# Patient Record
Sex: Female | Born: 1960 | Race: White | Hispanic: No | Marital: Single | Smoking: Current some day smoker
Health system: Southern US, Community
[De-identification: ages and names within clinical notes are randomized; demographics above are authoritative.]

## PROBLEM LIST (undated history)

## (undated) DIAGNOSIS — G5600 Carpal tunnel syndrome, unspecified upper limb: Secondary | ICD-10-CM

## (undated) DIAGNOSIS — N289 Disorder of kidney and ureter, unspecified: Secondary | ICD-10-CM

## (undated) DIAGNOSIS — R069 Unspecified abnormalities of breathing: Secondary | ICD-10-CM

## (undated) DIAGNOSIS — M1990 Unspecified osteoarthritis, unspecified site: Secondary | ICD-10-CM

## (undated) DIAGNOSIS — K7460 Unspecified cirrhosis of liver: Secondary | ICD-10-CM

## (undated) DIAGNOSIS — J449 Chronic obstructive pulmonary disease, unspecified: Secondary | ICD-10-CM

## (undated) HISTORY — DX: Chronic obstructive pulmonary disease, unspecified: J44.9

## (undated) HISTORY — PX: ABDOMINAL HYSTERECTOMY: SHX81

## (undated) HISTORY — DX: Unspecified cirrhosis of liver: K74.60

## (undated) HISTORY — DX: Unspecified abnormalities of breathing: R06.9

## (undated) HISTORY — PX: APPENDECTOMY: SHX54

---

## 2004-07-28 DIAGNOSIS — K7460 Unspecified cirrhosis of liver: Secondary | ICD-10-CM

## 2004-07-28 HISTORY — DX: Unspecified cirrhosis of liver: K74.60

## 2012-07-28 HISTORY — PX: HAND SURGERY: SHX662

## 2013-07-28 DIAGNOSIS — G5600 Carpal tunnel syndrome, unspecified upper limb: Secondary | ICD-10-CM

## 2013-07-28 HISTORY — DX: Carpal tunnel syndrome, unspecified upper limb: G56.00

## 2015-06-04 ENCOUNTER — Emergency Department
Admission: EM | Admit: 2015-06-04 | Discharge: 2015-06-04 | Disposition: A | Discharge status: Home / Self Care | Payer: Medicaid Other | Attending: Emergency Medicine | Admitting: Emergency Medicine

## 2015-06-04 ENCOUNTER — Emergency Department: Payer: Medicaid Other

## 2015-06-04 DIAGNOSIS — J441 Chronic obstructive pulmonary disease with (acute) exacerbation: Secondary | ICD-10-CM | POA: Insufficient documentation

## 2015-06-04 DIAGNOSIS — Z720 Tobacco use: Secondary | ICD-10-CM | POA: Insufficient documentation

## 2015-06-04 DIAGNOSIS — J189 Pneumonia, unspecified organism: Secondary | ICD-10-CM | POA: Insufficient documentation

## 2015-06-04 DIAGNOSIS — Z880 Allergy status to penicillin: Secondary | ICD-10-CM | POA: Insufficient documentation

## 2015-06-04 DIAGNOSIS — R0602 Shortness of breath: Secondary | ICD-10-CM

## 2015-06-04 DIAGNOSIS — R079 Chest pain, unspecified: Secondary | ICD-10-CM

## 2015-06-04 HISTORY — DX: Unspecified osteoarthritis, unspecified site: M19.90

## 2015-06-04 HISTORY — DX: Disorder of kidney and ureter, unspecified: N28.9

## 2015-06-04 HISTORY — DX: Carpal tunnel syndrome, unspecified upper limb: G56.00

## 2015-06-04 LAB — CBC
HCT: 41 % (ref 35.0–47.0)
HEMOGLOBIN: 14.2 g/dL (ref 12.0–16.0)
MCH: 31.1 pg (ref 26.0–34.0)
MCHC: 34.5 g/dL (ref 32.0–36.0)
MCV: 90.2 fL (ref 80.0–100.0)
Platelets: 304 10*3/uL (ref 150–440)
RBC: 4.55 MIL/uL (ref 3.80–5.20)
RDW: 13 % (ref 11.5–14.5)
WBC: 8.1 10*3/uL (ref 3.6–11.0)

## 2015-06-04 LAB — BASIC METABOLIC PANEL
ANION GAP: 16 — AB (ref 5–15)
BUN: 6 mg/dL (ref 6–20)
CHLORIDE: 102 mmol/L (ref 101–111)
CO2: 24 mmol/L (ref 22–32)
CREATININE: 0.77 mg/dL (ref 0.44–1.00)
Calcium: 9.1 mg/dL (ref 8.9–10.3)
GFR calc non Af Amer: >60 mL/min (ref >60)
Glucose, Bld: 108 mg/dL — ABNORMAL HIGH (ref 65–99)
POTASSIUM: 3.8 mmol/L (ref 3.5–5.1)
SODIUM: 142 mmol/L (ref 135–145)

## 2015-06-04 LAB — TROPONIN I: Troponin I: <0.03 ng/mL (ref <0.031)

## 2015-06-04 MED ORDER — BENZONATATE 100 MG PO CAPS: 100.0000 mg | ORAL_CAPSULE | Freq: Four times a day (QID) | ORAL | Status: AC | PRN

## 2015-06-04 MED ORDER — LEVOFLOXACIN IN D5W 750 MG/150ML IV SOLN
750.0000 mg | Freq: Once | INTRAVENOUS | Status: AC
  Administered 2015-06-04: 750 mg via INTRAVENOUS
  Filled 2015-06-04: qty 150

## 2015-06-04 MED ORDER — IPRATROPIUM-ALBUTEROL 0.5-2.5 (3) MG/3ML IN SOLN
3.0000 mL | Freq: Once | RESPIRATORY_TRACT | Status: AC
  Administered 2015-06-04: 3 mL via RESPIRATORY_TRACT
  Filled 2015-06-04 (×2): qty 3

## 2015-06-04 MED ORDER — LEVOFLOXACIN IN D5W 750 MG/150ML IV SOLN
INTRAVENOUS | Status: AC
  Administered 2015-06-04: 750 mg via INTRAVENOUS
  Filled 2015-06-04: qty 150

## 2015-06-04 MED ORDER — IPRATROPIUM-ALBUTEROL 0.5-2.5 (3) MG/3ML IN SOLN
3.0000 mL | Freq: Once | RESPIRATORY_TRACT | Status: AC
  Administered 2015-06-04: 3 mL via RESPIRATORY_TRACT
  Filled 2015-06-04: qty 3

## 2015-06-04 MED ORDER — LEVOFLOXACIN 750 MG PO TABS: 750.0000 mg | ORAL_TABLET | Freq: Every day | ORAL | Status: AC

## 2015-06-04 MED ORDER — METHYLPREDNISOLONE SODIUM SUCC 125 MG IJ SOLR
125.0000 mg | Freq: Once | INTRAMUSCULAR | Status: AC
  Administered 2015-06-04: 125 mg via INTRAVENOUS

## 2015-06-04 MED ORDER — PREDNISONE 20 MG PO TABS
60.0000 mg | ORAL_TABLET | Freq: Every day | ORAL | Status: AC
Start: 2015-06-04 — End: 2016-06-03

## 2015-06-04 MED ORDER — METHYLPREDNISOLONE SODIUM SUCC 125 MG IJ SOLR
INTRAMUSCULAR | Status: AC
  Administered 2015-06-04: 125 mg via INTRAVENOUS
  Filled 2015-06-04: qty 2

## 2015-06-04 NOTE — ED Provider Notes (Signed)
[facility] Emergency Department Provider Note  ____________________________________________  Time seen: Approximately 5:02 PM  I have reviewed the triage vital signs and the nursing notes.   HISTORY  Chief Complaint Shortness of Breath; Pneumonia; and Cough    HPI Lisa Stout is a 54 y.o. female with a history of COPD and ongoing tobacco abuse presenting with 1 week of cough productive of yellow thick phlegm, fever to 103, and shortness of breath with exertion. Patient has also been having myalgias. She has been taking albuterol nebulizers at home with minimal improvement. Patient also reports a central chest "pressure" that is there all the time; nothing makes it better or worse.   Past Medical History  Diagnosis Date  . Carpal tunnel syndrome   . Arthritis   . Renal disorder     There are no active problems to display for this patient.   History reviewed. No pertinent past surgical history.  Current Outpatient Rx  Name  Route  Sig  Dispense  Refill  . benzonatate (TESSALON PERLES) 100 MG capsule   Oral   Take 1 capsule (100 mg total) by mouth every 6 (six) hours as needed for cough.   20 capsule   0   . levofloxacin (LEVAQUIN) 750 MG tablet   Oral   Take 1 tablet (750 mg total) by mouth daily.   7 tablet   0   . predniSONE (DELTASONE) 20 MG tablet   Oral   Take 3 tablets (60 mg total) by mouth daily.   21 tablet   0     Allergies Codeine; Penicillins; and Tramadol  No family history on file.  Social History Social History  Substance Use Topics  . Smoking status: Current Some Day Smoker  . Smokeless tobacco: None  . Alcohol Use: No    Review of Systems Constitutional: Positive fever. Negative lightheadedness or syncope. Eyes: No visual changes. ENT: No sore throat. Cardiovascular: Positive chest pain, negative palpitations. Respiratory: Positive shortness of breath.  Positive productive cough. Gastrointestinal: No  abdominal pain.  No nausea, no vomiting.  No diarrhea.  No constipation. Genitourinary: Negative for dysuria. Musculoskeletal: Negative for back pain. Skin: Negative for rash. Neurological: Negative for headaches, focal weakness or numbness.  10-point ROS otherwise negative.  ____________________________________________   PHYSICAL EXAM:  VITAL SIGNS: ED Triage Vitals  Enc Vitals Group     BP 06/04/15 1624 124/68 mmHg     Pulse Rate 06/04/15 1624 105     Resp 06/04/15 1624 22     Temp 06/04/15 1624 98.1 F (36.7 C)     Temp Source 06/04/15 1624 Oral     SpO2 06/04/15 1624 98 %     Weight 06/04/15 1624 160 lb (72.576 kg)     Height 06/04/15 1624 5\' 4"  (1.626 m)     Head Cir --      Peak Flow --      Pain Score 06/04/15 1625 8     Pain Loc --      Pain Edu? --      Excl. in GC? --     Constitutional: Alert and oriented. Well appearing and in no acute distress. Answer question appropriately. Eyes: Conjunctivae are normal.  EOMI. Head: Atraumatic. Nose: No congestion/rhinnorhea. Mouth/Throat: Mucous membranes are moist.  Neck: No stridor.  Supple.  No JVD. Cardiovascular: Normal rate, regular rhythm. No murmurs, rubs or gallops.  Respiratory: Mild tachypnea but able to speak in full sentences. No accessory muscle use or  retractions. Patient has mild Rales diffusely in the right lung field; left lung field is clear. No wheezes or rhonchi at this time. Gastrointestinal: Soft and nontender. No distention. No peritoneal signs. Musculoskeletal: No LE edema. No palpable cords or calf tenderness to palpation; no Homans sign. Neurologic:  Normal speech and language. No gross focal neurologic deficits are appreciated.  Skin:  Skin is warm, dry and intact. No rash noted. Psychiatric: Mood and affect are normal. Speech and behavior are normal.  Normal judgement.  ____________________________________________   LABS (all labs ordered are listed, but only abnormal results are  displayed)  Labs Reviewed  BASIC METABOLIC PANEL - Abnormal; Notable for the following:    Glucose, Bld 108 (*)    Anion gap 16 (*)    All other components within normal limits  CBC  TROPONIN I   ____________________________________________  EKG  ED ECG REPORT I, Rockne Menghini, the attending physician, personally viewed and interpreted this ECG.   Date: 06/04/2015  EKG Time: 1630  Rate: 88  Rhythm: normal sinus rhythm  Axis: Leftward  Intervals:none  ST&T Change: No ST elevation. No ischemic changes.  ____________________________________________  RADIOLOGY  Dg Chest 2 View  06/04/2015  CLINICAL DATA:  Cough, shortness of breath for 5 days. EXAM: CHEST  2 VIEW COMPARISON:  None. FINDINGS: The heart size and mediastinal contours are within normal limits. Both lungs are clear. The visualized skeletal structures are unremarkable. IMPRESSION: No active cardiopulmonary disease. Electronically Signed   By: Charlett Nose M.D.   On: 06/04/2015 16:46    ____________________________________________   PROCEDURES  Procedure(s) performed: None  Critical Care performed: No ____________________________________________   INITIAL IMPRESSION / ASSESSMENT AND PLAN / ED COURSE  Pertinent labs & imaging results that were available during my care of the patient were reviewed by me and considered in my medical decision making (see chart for details).  54 y.o. female with a history of COPD presenting with 1 week of productive cough, fever and shortness of breath. Here we have a related the patient and she does maintain her oxygen saturations at 97% or greater with ambulation. She does become more And mildly tachycardic with walking but this resolves when she rests. Chest x-ray does not show any infiltrate, however I do hear rales in her right lung base. I'll plan to do basic lab work and initiate IV steroids and antibiotics; however if the patient is feeling better, we will plan for  discharge and oral antibiotics with close PMD follow-up.  ----------------------------------------- 6:44 PM on 06/04/2015 -----------------------------------------  The patient is afebrile and does not have any infiltrate on exam. She does not have an elevated white blood cell count. She is able to ambulate and maintain her oxygen saturation. She has received a DuoNeb, steroids, and IV antibiotics 1. I'll plan to discharge her home with oral antibiotics and PMD follow-up tomorrow. She understands return precautions and follow-up instructions. ____________________________________________  FINAL CLINICAL IMPRESSION(S) / ED DIAGNOSES  Final diagnoses:  Pneumonia, unspecified laterality, unspecified part of lung  Shortness of breath  Chest pain, unspecified chest pain type      NEW MEDICATIONS STARTED DURING THIS VISIT:  Discharge Medication List as of 06/04/2015  6:49 PM    START taking these medications   Details  benzonatate (TESSALON PERLES) 100 MG capsule Take 1 capsule (100 mg total) by mouth every 6 (six) hours as needed for cough., Starting 06/04/2015, Until Tue 06/03/16, Print    levofloxacin (LEVAQUIN) 750 MG tablet Take 1 tablet (  750 mg total) by mouth daily., Starting 06/04/2015, Until Mon 06/11/15, Print    predniSONE (DELTASONE) 20 MG tablet Take 3 tablets (60 mg total) by mouth daily., Starting 06/04/2015, Until Tue 06/03/16, Print         Rockne Menghini, MD 06/04/15 2219

## 2015-06-04 NOTE — Discharge Instructions (Signed)
Please take the entire course of antibiotics and steroids even if you're feeling better. Please make a follow-up appointment with your regular doctor at the [facility] clinic tomorrow or the next day.  Please return to the emergency department if he developed shortness of breath, fever, inability to keep down fluids, chest pain, or any other symptoms concerning to you.

## 2015-06-04 NOTE — ED Notes (Signed)
Pt c/o cough, SOB X 5 days. Pt believes that she has pneumonia. Pt reports fever 99-103F. Pt productive sputum yellow in color. Pt has been using inhaler with no relief. Pt in no RR distress at this time. Color WNL. Tylenol taken PTA

## 2015-06-04 NOTE — ED Notes (Signed)
Pt in with complaints of SOB, fever, diarrhea, vomiting, body aches x 1 week.  Pt reports using albuterol nebulizer treatments at home q4hrs which have not helped breathing.

## 2015-10-09 ENCOUNTER — Ambulatory Visit: Payer: Medicaid Other | Attending: Anesthesiology | Admitting: Anesthesiology

## 2015-10-09 ENCOUNTER — Encounter: Payer: Self-pay | Admitting: Anesthesiology

## 2015-10-09 VITALS — BP 101/87 | HR 67 | Temp 97.5°F | Resp 18 | Ht 64.0 in | Wt 160.0 lb

## 2015-10-09 DIAGNOSIS — Z9889 Other specified postprocedural states: Secondary | ICD-10-CM | POA: Insufficient documentation

## 2015-10-09 DIAGNOSIS — M542 Cervicalgia: Secondary | ICD-10-CM | POA: Insufficient documentation

## 2015-10-09 DIAGNOSIS — M5416 Radiculopathy, lumbar region: Secondary | ICD-10-CM

## 2015-10-09 DIAGNOSIS — M5417 Radiculopathy, lumbosacral region: Secondary | ICD-10-CM

## 2015-10-09 DIAGNOSIS — F17210 Nicotine dependence, cigarettes, uncomplicated: Secondary | ICD-10-CM | POA: Insufficient documentation

## 2015-10-09 DIAGNOSIS — R52 Pain, unspecified: Secondary | ICD-10-CM | POA: Diagnosis not present

## 2015-10-09 DIAGNOSIS — F418 Other specified anxiety disorders: Secondary | ICD-10-CM | POA: Diagnosis not present

## 2015-10-09 DIAGNOSIS — M5116 Intervertebral disc disorders with radiculopathy, lumbar region: Secondary | ICD-10-CM | POA: Insufficient documentation

## 2015-10-09 DIAGNOSIS — K589 Irritable bowel syndrome without diarrhea: Secondary | ICD-10-CM | POA: Diagnosis not present

## 2015-10-09 DIAGNOSIS — M5136 Other intervertebral disc degeneration, lumbar region: Secondary | ICD-10-CM | POA: Diagnosis not present

## 2015-10-09 DIAGNOSIS — M5030 Other cervical disc degeneration, unspecified cervical region: Secondary | ICD-10-CM | POA: Insufficient documentation

## 2015-10-09 DIAGNOSIS — K7460 Unspecified cirrhosis of liver: Secondary | ICD-10-CM | POA: Diagnosis not present

## 2015-10-09 DIAGNOSIS — J45909 Unspecified asthma, uncomplicated: Secondary | ICD-10-CM | POA: Diagnosis not present

## 2015-10-09 DIAGNOSIS — J439 Emphysema, unspecified: Secondary | ICD-10-CM | POA: Insufficient documentation

## 2015-10-09 DIAGNOSIS — M069 Rheumatoid arthritis, unspecified: Secondary | ICD-10-CM | POA: Insufficient documentation

## 2015-10-09 DIAGNOSIS — Z90710 Acquired absence of both cervix and uterus: Secondary | ICD-10-CM | POA: Insufficient documentation

## 2015-10-09 DIAGNOSIS — K219 Gastro-esophageal reflux disease without esophagitis: Secondary | ICD-10-CM | POA: Insufficient documentation

## 2015-10-09 DIAGNOSIS — G5600 Carpal tunnel syndrome, unspecified upper limb: Secondary | ICD-10-CM | POA: Diagnosis not present

## 2015-10-09 DIAGNOSIS — M47812 Spondylosis without myelopathy or radiculopathy, cervical region: Secondary | ICD-10-CM

## 2015-10-09 DIAGNOSIS — N289 Disorder of kidney and ureter, unspecified: Secondary | ICD-10-CM | POA: Insufficient documentation

## 2015-10-09 DIAGNOSIS — M549 Dorsalgia, unspecified: Secondary | ICD-10-CM | POA: Diagnosis present

## 2015-10-09 DIAGNOSIS — M25559 Pain in unspecified hip: Secondary | ICD-10-CM | POA: Diagnosis present

## 2015-10-09 NOTE — Patient Instructions (Signed)
Epidural Steroid Injection An epidural steroid injection is given to relieve pain in your neck, back, or legs that is caused by the irritation or swelling of a nerve root. This procedure involves injecting a steroid and numbing medicine (anesthetic) into the epidural space. The epidural space is the space between the outer covering of your spinal cord and the bones that form your backbone (vertebra).  LET YOUR HEALTH CARE PROVIDER KNOW ABOUT:   Any allergies you have.  All medicines you are taking, including vitamins, herbs, eye drops, creams, and over-the-counter medicines such as aspirin.  Previous problems you or members of your family have had with the use of anesthetics.  Any blood disorders or blood clotting disorders you have.  Previous surgeries you have had.  Medical conditions you have. RISKS AND COMPLICATIONS Generally, this is a safe procedure. However, as with any procedure, complications can occur. Possible complications of epidural steroid injection include:  Headache.  Bleeding.  Infection.  Allergic reaction to the medicines.  Damage to your nerves. The response to this procedure depends on the underlying cause of the pain and its duration. People who have long-term (chronic) pain are less likely to benefit from epidural steroids than are those people whose pain comes on strong and suddenly. BEFORE THE PROCEDURE   Ask your health care provider about changing or stopping your regular medicines. You may be advised to stop taking blood-thinning medicines a few days before the procedure.  You may be given medicines to reduce anxiety.  Arrange for someone to take you home after the procedure. PROCEDURE   You will remain awake during the procedure. You may receive medicine to make you relaxed.  You will be asked to lie on your stomach.  The injection site will be cleaned.  The injection site will be numbed with a medicine (local anesthetic).  A needle will be  injected through your skin into the epidural space.  Your health care provider will use an X-ray machine to ensure that the steroid is delivered closest to the affected nerve. You may have minimal discomfort at this time.  Once the needle is in the right position, the local anesthetic and the steroid will be injected into the epidural space.  The needle will then be removed and a bandage will be applied to the injection site. AFTER THE PROCEDURE   You may be monitored for a short time before you go home.  You may feel weakness or numbness in your arm or leg, which disappears within hours.  You may be allowed to eat, drink, and take your regular medicine.  You may have soreness at the site of the injection.   This information is not intended to replace advice given to you by your health care provider. Make sure you discuss any questions you have with your health care provider.   Document Released: 10/21/2007 Document Revised: 03/16/2013 Document Reviewed: 12/31/2012 Elsevier Interactive Patient Education 2016 [employer] GENERAL RISKS AND COMPLICATIONS  What are the risk, side effects and possible complications? Generally speaking, most procedures are safe.  However, with any procedure there are risks, side effects, and the possibility of complications.  The risks and complications are dependent upon the sites that are lesioned, or the type of nerve block to be performed.  The closer the procedure is to the spine, the more serious the risks are.  Great care is taken when placing the radio frequency needles, block needles or lesioning probes, but sometimes complications can occur. Infection: Any   time there is an injection through the skin, there is a risk of infection.  This is why sterile conditions are used for these blocks.  There are four possible types of infection. Localized skin infection. Central Nervous System Infection-This can be in the form of Meningitis, which can be  deadly. Epidural Infections-This can be in the form of an epidural abscess, which can cause pressure inside of the spine, causing compression of the spinal cord with subsequent paralysis. This would require an emergency surgery to decompress, and there are no guarantees that the patient would recover from the paralysis. Discitis-This is an infection of the intervertebral discs.  It occurs in about 1% of discography procedures.  It is difficult to treat and it may lead to surgery.        2. Pain: the needles have to go through skin and soft tissues, will cause soreness.       3. Damage to internal structures:  The nerves to be lesioned may be near blood vessels or    other nerves which can be potentially damaged.       4. Bleeding: Bleeding is more common if the patient is taking blood thinners such as  aspirin, Coumadin, Ticiid, Plavix, etc., or if he/she have some genetic predisposition  such as hemophilia. Bleeding into the spinal canal can cause compression of the spinal  cord with subsequent paralysis.  This would require an emergency surgery to  decompress and there are no guarantees that the patient would recover from the  paralysis.       5. Pneumothorax:  Puncturing of a lung is a possibility, every time a needle is introduced in  the area of the chest or upper back.  Pneumothorax refers to free air around the  collapsed lung(s), inside of the thoracic cavity (chest cavity).  Another two possible  complications related to a similar event would include: Hemothorax and Chylothorax.   These are variations of the Pneumothorax, where instead of air around the collapsed  lung(s), you may have blood or chyle, respectively.       6. Spinal headaches: They may occur with any procedures in the area of the spine.       7. Persistent CSF (Cerebro-Spinal Fluid) leakage: This is a rare problem, but may occur  with prolonged intrathecal or epidural catheters either due to the formation of a fistulous  track or a  dural tear.       8. Nerve damage: By working so close to the spinal cord, there is always a possibility of  nerve damage, which could be as serious as a permanent spinal cord injury with  paralysis.       9. Death:  Although rare, severe deadly allergic reactions known as "Anaphylactic  reaction" can occur to any of the medications used.      10. Worsening of the symptoms:  We can always make thing worse.  What are the chances of something like this happening? Chances of any of this occuring are extremely low.  By statistics, you have more of a chance of getting killed in a motor vehicle accident: while driving to the hospital than any of the above occurring .  Nevertheless, you should be aware that they are possibilities.  In general, it is similar to taking a shower.  Everybody knows that you can slip, hit your head and get killed.  Does that mean that you should not shower again?  Nevertheless always keep in mind that statistics   do not mean anything if you happen to be on the wrong side of them.  Even if a procedure has a 1 (one) in a 1,000,000 (million) chance of going wrong, it you happen to be that one..Also, keep in mind that by statistics, you have more of a chance of having something go wrong when taking medications.  Who should not have this procedure? If you are on a blood thinning medication (e.g. Coumadin, Plavix, see list of "Blood Thinners"), or if you have an active infection going on, you should not have the procedure.  If you are taking any blood thinners, please inform your physician.  How should I prepare for this procedure? Do not eat or drink anything at least six hours prior to the procedure. Bring a driver with you .  It cannot be a taxi. Come accompanied by an adult that can drive you back, and that is strong enough to help you if your legs get weak or numb from the local anesthetic. Take all of your medicines the morning of the procedure with just enough water to swallow  them. If you have diabetes, make sure that you are scheduled to have your procedure done first thing in the morning, whenever possible. If you have diabetes, take only half of your insulin dose and notify our nurse that you have done so as soon as you arrive at the clinic. If you are diabetic, but only take blood sugar pills (oral hypoglycemic), then do not take them on the morning of your procedure.  You may take them after you have had the procedure. Do not take aspirin or any aspirin-containing medications, at least eleven (11) days prior to the procedure.  They may prolong bleeding. Wear loose fitting clothing that may be easy to take off and that you would not mind if it got stained with Betadine or blood. Do not wear any jewelry or perfume Remove any nail coloring.  It will interfere with some of our monitoring equipment.  NOTE: Remember that this is not meant to be interpreted as a complete list of all possible complications.  Unforeseen problems may occur.  BLOOD THINNERS The following drugs contain aspirin or other products, which can cause increased bleeding during surgery and should not be taken for 2 weeks prior to and 1 week after surgery.  If you should need take something for relief of minor pain, you may take acetaminophen which is found in Tylenol,m Datril, Anacin-3 and Panadol. It is not blood thinner. The products listed below are.  Do not take any of the products listed below in addition to any listed on your instruction sheet.  A.P.C or A.P.C with Codeine Codeine Phosphate Capsules #3 Ibuprofen Ridaura  ABC compound Congesprin Imuran rimadil  Advil Cope Indocin Robaxisal  Alka-Seltzer Effervescent Pain Reliever and Antacid Coricidin or Coricidin-D  Indomethacin Rufen  Alka-Seltzer plus Cold Medicine Cosprin Ketoprofen S-A-C Tablets  Anacin Analgesic Tablets or Capsules Coumadin Korlgesic Salflex  Anacin Extra Strength Analgesic tablets or capsules CP-2 Tablets Lanoril  Salicylate  Anaprox Cuprimine Capsules Levenox Salocol  Anexsia-D Dalteparin Magan Salsalate  Anodynos Darvon compound Magnesium Salicylate Sine-off  Ansaid Dasin Capsules Magsal Sodium Salicylate  Anturane Depen Capsules Marnal Soma  APF Arthritis pain formula Dewitt's Pills Measurin Stanback  Argesic Dia-Gesic Meclofenamic Sulfinpyrazone  Arthritis Bayer Timed Release Aspirin Diclofenac Meclomen Sulindac  Arthritis pain formula Anacin Dicumarol Medipren Supac  Analgesic (Safety coated) Arthralgen Diffunasal Mefanamic Suprofen  Arthritis Strength Bufferin Dihydrocodeine Mepro Compound Suprol  Arthropan   liquid Dopirydamole Methcarbomol with Aspirin Synalgos  ASA tablets/Enseals Disalcid Micrainin Tagament  Ascriptin Doan's Midol Talwin  Ascriptin A/D Dolene Mobidin Tanderil  Ascriptin Extra Strength Dolobid Moblgesic Ticlid  Ascriptin with Codeine Doloprin or Doloprin with Codeine Momentum Tolectin  Asperbuf Duoprin Mono-gesic Trendar  Aspergum Duradyne Motrin or Motrin IB Triminicin  Aspirin plain, buffered or enteric coated Durasal Myochrisine Trigesic  Aspirin Suppositories Easprin Nalfon Trillsate  Aspirin with Codeine Ecotrin Regular or Extra Strength Naprosyn Uracel  Atromid-S Efficin Naproxen Ursinus  Auranofin Capsules Elmiron Neocylate Vanquish  Axotal Emagrin Norgesic Verin  Azathioprine Empirin or Empirin with Codeine Normiflo Vitamin E  Azolid Emprazil Nuprin Voltaren  Bayer Aspirin plain, buffered or children's or timed BC Tablets or powders Encaprin Orgaran Warfarin Sodium  Buff-a-Comp Enoxaparin Orudis Zorpin  Buff-a-Comp with Codeine Equegesic Os-Cal-Gesic   Buffaprin Excedrin plain, buffered or Extra Strength Oxalid   Bufferin Arthritis Strength Feldene Oxphenbutazone   Bufferin plain or Extra Strength Feldene Capsules Oxycodone with Aspirin   Bufferin with Codeine Fenoprofen Fenoprofen Pabalate or Pabalate-SF   Buffets II Flogesic Panagesic   Buffinol plain or  Extra Strength Florinal or Florinal with Codeine Panwarfarin   Buf-Tabs Flurbiprofen Penicillamine   Butalbital Compound Four-way cold tablets Penicillin   Butazolidin Fragmin Pepto-Bismol   Carbenicillin Geminisyn Percodan   Carna Arthritis Reliever Geopen Persantine   Carprofen Gold's salt Persistin   Chloramphenicol Goody's Phenylbutazone   Chloromycetin Haltrain Piroxlcam   Clmetidine heparin Plaquenil   Cllnoril Hyco-pap Ponstel   Clofibrate Hydroxy chloroquine Propoxyphen         Before stopping any of these medications, be sure to consult the physician who ordered them.  Some, such as Coumadin (Warfarin) are ordered to prevent or treat serious conditions such as "deep thrombosis", "pumonary embolisms", and other heart problems.  The amount of time that you may need off of the medication may also vary with the medication and the reason for which you were taking it.  If you are taking any of these medications, please make sure you notify your pain physician before you undergo any procedures.          

## 2015-10-09 NOTE — Progress Notes (Signed)
Safety precautions to be maintained throughout the outpatient stay will include: orient to surroundings, keep bed in low position, maintain call bell within reach at all times, provide assistance with transfer out of bed and ambulation.  

## 2015-10-10 ENCOUNTER — Encounter: Payer: Self-pay | Admitting: Anesthesiology
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
## 2015-10-13 LAB — TOXASSURE SELECT 13 (MW), URINE: PDF: 0

## 2015-12-05 ENCOUNTER — Ambulatory Visit: Payer: Medicaid Other | Attending: Anesthesiology | Admitting: Anesthesiology

## 2015-12-05 ENCOUNTER — Encounter: Payer: Self-pay | Admitting: Anesthesiology

## 2015-12-05 VITALS — BP 124/75 | HR 78 | Temp 98.0°F | Resp 16 | Ht 64.0 in | Wt 161.0 lb

## 2015-12-05 DIAGNOSIS — R52 Pain, unspecified: Secondary | ICD-10-CM | POA: Diagnosis not present

## 2015-12-05 DIAGNOSIS — M5417 Radiculopathy, lumbosacral region: Secondary | ICD-10-CM | POA: Diagnosis not present

## 2015-12-05 DIAGNOSIS — F17210 Nicotine dependence, cigarettes, uncomplicated: Secondary | ICD-10-CM | POA: Insufficient documentation

## 2015-12-05 DIAGNOSIS — M5030 Other cervical disc degeneration, unspecified cervical region: Secondary | ICD-10-CM | POA: Insufficient documentation

## 2015-12-05 DIAGNOSIS — M47812 Spondylosis without myelopathy or radiculopathy, cervical region: Secondary | ICD-10-CM

## 2015-12-05 DIAGNOSIS — M5136 Other intervertebral disc degeneration, lumbar region: Secondary | ICD-10-CM

## 2015-12-05 DIAGNOSIS — M5116 Intervertebral disc disorders with radiculopathy, lumbar region: Secondary | ICD-10-CM | POA: Diagnosis not present

## 2015-12-05 DIAGNOSIS — M542 Cervicalgia: Secondary | ICD-10-CM | POA: Diagnosis not present

## 2015-12-05 DIAGNOSIS — M5416 Radiculopathy, lumbar region: Secondary | ICD-10-CM

## 2015-12-05 MED ORDER — SODIUM CHLORIDE 0.9 % IJ SOLN
INTRAMUSCULAR | Status: AC
  Administered 2015-12-05: 5 mL
  Filled 2015-12-05: qty 20

## 2015-12-05 MED ORDER — SODIUM CHLORIDE 0.9% FLUSH: 10.0000 mL | Freq: Once | INTRAVENOUS | Status: AC

## 2015-12-05 MED ORDER — LIDOCAINE HCL (PF) 1 % IJ SOLN
INTRAMUSCULAR | Status: AC
  Administered 2015-12-05: 10 mL via SUBCUTANEOUS
  Filled 2015-12-05: qty 5

## 2015-12-05 MED ORDER — LACTATED RINGERS IV SOLN: 1000.0000 mL | INTRAVENOUS | Status: AC

## 2015-12-05 MED ORDER — ROPIVACAINE HCL 2 MG/ML IJ SOLN
10.0000 mL | Freq: Once | INTRAMUSCULAR | Status: AC
  Administered 2015-12-05: 1 mL via EPIDURAL

## 2015-12-05 MED ORDER — IOPAMIDOL (ISOVUE-M 200) INJECTION 41%: 20.0000 mL | Freq: Once | INTRAMUSCULAR | Status: AC | PRN

## 2015-12-05 MED ORDER — TRIAMCINOLONE ACETONIDE 40 MG/ML IJ SUSP
INTRAMUSCULAR | Status: AC
  Administered 2015-12-05: 40 mg
  Filled 2015-12-05: qty 1

## 2015-12-05 MED ORDER — HYDROCODONE-ACETAMINOPHEN 5-325 MG PO TABS: 1.0000 | ORAL_TABLET | Freq: Four times a day (QID) | ORAL | Status: DC | PRN

## 2015-12-05 MED ORDER — TRIAMCINOLONE ACETONIDE 40 MG/ML IJ SUSP
40.0000 mg | Freq: Once | INTRAMUSCULAR | Status: AC
  Administered 2015-12-05: 40 mg

## 2015-12-05 MED ORDER — LIDOCAINE HCL (PF) 1 % IJ SOLN
5.0000 mL | Freq: Once | INTRAMUSCULAR | Status: AC
  Administered 2015-12-05: 10 mL via SUBCUTANEOUS

## 2015-12-05 MED ORDER — MIDAZOLAM HCL 5 MG/5ML IJ SOLN
5.0000 mg | Freq: Once | INTRAMUSCULAR | Status: AC
  Administered 2015-12-05: 4 mg via INTRAVENOUS

## 2015-12-05 MED ORDER — FENTANYL CITRATE (PF) 100 MCG/2ML IJ SOLN
INTRAMUSCULAR | Status: AC
  Filled 2015-12-05: qty 2

## 2015-12-05 MED ORDER — ROPIVACAINE HCL 2 MG/ML IJ SOLN
INTRAMUSCULAR | Status: AC
  Administered 2015-12-05: 1 mL via EPIDURAL
  Filled 2015-12-05: qty 10

## 2015-12-05 MED ORDER — MIDAZOLAM HCL 5 MG/5ML IJ SOLN
INTRAMUSCULAR | Status: AC
  Administered 2015-12-05: 4 mg via INTRAVENOUS
  Filled 2015-12-05: qty 5
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
## 2015-12-05 NOTE — Patient Instructions (Signed)
Epidural Steroid Injection Patient Information  Description: The epidural space surrounds the nerves as they exit the spinal cord.  In some patients, the nerves can be compressed and inflamed by a bulging disc or a tight spinal canal (spinal stenosis).  By injecting steroids into the epidural space, we can bring irritated nerves into direct contact with a potentially helpful medication.  These steroids act directly on the irritated nerves and can reduce swelling and inflammation which often leads to decreased pain.  Epidural steroids may be injected anywhere along the spine and from the neck to the low back depending upon the location of your pain.   After numbing the skin with local anesthetic (like Novocaine), a small needle is passed into the epidural space slowly.  You may experience a sensation of pressure while this is being done.  The entire block usually last less than 10 minutes.  Conditions which may be treated by epidural steroids:   Low back and leg pain  Neck and arm pain  Spinal stenosis  Post-laminectomy syndrome  Herpes zoster (shingles) pain  Pain from compression fractures  Preparation for the injection:  1. Do not eat any solid food or dairy products within 8 hours of your appointment.  2. You may drink clear liquids up to 3 hours before appointment.  Clear liquids include water, black coffee, juice or soda.  No milk or cream please. 3. You may take your regular medication, including pain medications, with a sip of water before your appointment  Diabetics should hold regular insulin (if taken separately) and take 1/2 normal NPH dos the morning of the procedure.  Carry some sugar containing items with you to your appointment. 4. A driver must accompany you and be prepared to drive you home after your procedure.  5. Bring all your current medications with your. 6. An IV may be inserted and sedation may be given at the discretion of the physician.   7. A blood pressure  cuff, EKG and other monitors will often be applied during the procedure.  Some patients may need to have extra oxygen administered for a short period. 8. You will be asked to provide medical information, including your allergies, prior to the procedure.  We must know immediately if you are taking blood thinners (like Coumadin/Warfarin)  Or if you are allergic to IV iodine contrast (dye). We must know if you could possible be pregnant.  Possible side-effects:  Bleeding from needle site  Infection (rare, may require surgery)  Nerve injury (rare)  Numbness & tingling (temporary)  Difficulty urinating (rare, temporary)  Spinal headache ( a headache worse with upright posture)  Light -headedness (temporary)  Pain at injection site (several days)  Decreased blood pressure (temporary)  Weakness in arm/leg (temporary)  Pressure sensation in back/neck (temporary)  Call if you experience:  Fever/chills associated with headache or increased back/neck pain.  Headache worsened by an upright position.  New onset weakness or numbness of an extremity below the injection site  Hives or difficulty breathing (go to the emergency room)  Inflammation or drainage at the infection site  Severe back/neck pain  Any new symptoms which are concerning to you  Please note:  Although the local anesthetic injected can often make your back or neck feel good for several hours after the injection, the pain will likely return.  It takes 3-7 days for steroids to work in the epidural space.  You may not notice any pain relief for at least that one week.    If effective, we will often do a series of three injections spaced 3-6 weeks apart to maximally decrease your pain.  After the initial series, we generally will wait several months before considering a repeat injection of the same type.  If you have any questions, please call [phone] [facility] 

## 2015-12-05 NOTE — Progress Notes (Signed)
Safety precautions to be maintained throughout the outpatient stay will include: orient to surroundings, keep bed in low position, maintain call bell within reach at all times, provide assistance with transfer out of bed and ambulation.  

## 2015-12-06 ENCOUNTER — Telehealth: Payer: Self-pay

## 2015-12-06 NOTE — Telephone Encounter (Signed)
Post procedure phone call.  States she is doing ok.   

## 2015-12-19 ENCOUNTER — Other Ambulatory Visit: Payer: Self-pay | Admitting: Preventative Medicine

## 2015-12-19 DIAGNOSIS — M542 Cervicalgia: Secondary | ICD-10-CM

## 2015-12-19 DIAGNOSIS — J439 Emphysema, unspecified: Secondary | ICD-10-CM

## 2016-01-02 ENCOUNTER — Ambulatory Visit: Payer: Medicaid Other | Attending: Anesthesiology | Admitting: Anesthesiology

## 2016-01-02 ENCOUNTER — Encounter: Payer: Self-pay | Admitting: Anesthesiology

## 2016-01-02 VITALS — BP 123/73 | HR 73 | Temp 98.0°F | Resp 16 | Ht 64.0 in | Wt 181.0 lb

## 2016-01-02 DIAGNOSIS — M542 Cervicalgia: Secondary | ICD-10-CM | POA: Insufficient documentation

## 2016-01-02 DIAGNOSIS — G5600 Carpal tunnel syndrome, unspecified upper limb: Secondary | ICD-10-CM | POA: Diagnosis not present

## 2016-01-02 DIAGNOSIS — M5030 Other cervical disc degeneration, unspecified cervical region: Secondary | ICD-10-CM | POA: Diagnosis not present

## 2016-01-02 DIAGNOSIS — M5136 Other intervertebral disc degeneration, lumbar region: Secondary | ICD-10-CM | POA: Diagnosis not present

## 2016-01-02 DIAGNOSIS — R079 Chest pain, unspecified: Secondary | ICD-10-CM | POA: Diagnosis not present

## 2016-01-02 DIAGNOSIS — J439 Emphysema, unspecified: Secondary | ICD-10-CM | POA: Insufficient documentation

## 2016-01-02 DIAGNOSIS — F419 Anxiety disorder, unspecified: Secondary | ICD-10-CM | POA: Diagnosis not present

## 2016-01-02 DIAGNOSIS — G8929 Other chronic pain: Secondary | ICD-10-CM | POA: Diagnosis not present

## 2016-01-02 DIAGNOSIS — M069 Rheumatoid arthritis, unspecified: Secondary | ICD-10-CM | POA: Insufficient documentation

## 2016-01-02 DIAGNOSIS — K7460 Unspecified cirrhosis of liver: Secondary | ICD-10-CM | POA: Insufficient documentation

## 2016-01-02 DIAGNOSIS — K219 Gastro-esophageal reflux disease without esophagitis: Secondary | ICD-10-CM | POA: Insufficient documentation

## 2016-01-02 DIAGNOSIS — G4730 Sleep apnea, unspecified: Secondary | ICD-10-CM | POA: Insufficient documentation

## 2016-01-02 DIAGNOSIS — J45909 Unspecified asthma, uncomplicated: Secondary | ICD-10-CM | POA: Diagnosis not present

## 2016-01-02 DIAGNOSIS — R51 Headache: Secondary | ICD-10-CM | POA: Diagnosis not present

## 2016-01-02 DIAGNOSIS — M5417 Radiculopathy, lumbosacral region: Secondary | ICD-10-CM | POA: Diagnosis not present

## 2016-01-02 DIAGNOSIS — M5416 Radiculopathy, lumbar region: Secondary | ICD-10-CM

## 2016-01-02 DIAGNOSIS — M5116 Intervertebral disc disorders with radiculopathy, lumbar region: Secondary | ICD-10-CM | POA: Diagnosis not present

## 2016-01-02 DIAGNOSIS — M549 Dorsalgia, unspecified: Secondary | ICD-10-CM | POA: Diagnosis present

## 2016-01-02 DIAGNOSIS — F17210 Nicotine dependence, cigarettes, uncomplicated: Secondary | ICD-10-CM | POA: Diagnosis not present

## 2016-01-02 MED ORDER — HYDROCODONE-ACETAMINOPHEN 5-325 MG PO TABS: 1.0000 | ORAL_TABLET | Freq: Four times a day (QID) | ORAL | Status: DC | PRN

## 2016-01-02 MED ORDER — LACTATED RINGERS IV SOLN: 1000.0000 mL | INTRAVENOUS | Status: AC

## 2016-01-02 MED ORDER — IOPAMIDOL (ISOVUE-M 200) INJECTION 41%
20.0000 mL | Freq: Once | INTRAMUSCULAR | Status: AC | PRN
  Administered 2016-01-02: 2 mL
  Filled 2016-01-02: qty 20

## 2016-01-02 MED ORDER — ROPIVACAINE HCL 2 MG/ML IJ SOLN
INTRAMUSCULAR | Status: AC
  Administered 2016-01-02: 1 mL via EPIDURAL
  Filled 2016-01-02: qty 10

## 2016-01-02 MED ORDER — LIDOCAINE HCL (PF) 1 % IJ SOLN
INTRAMUSCULAR | Status: AC
  Administered 2016-01-02: 6 mL via SUBCUTANEOUS
  Filled 2016-01-02: qty 5

## 2016-01-02 MED ORDER — TRIAMCINOLONE ACETONIDE 40 MG/ML IJ SUSP
INTRAMUSCULAR | Status: AC
  Administered 2016-01-02: 40 mg
  Filled 2016-01-02: qty 1

## 2016-01-02 MED ORDER — MIDAZOLAM HCL 5 MG/5ML IJ SOLN
INTRAMUSCULAR | Status: AC
  Administered 2016-01-02: 2 mg via INTRAVENOUS
  Filled 2016-01-02: qty 5

## 2016-01-02 MED ORDER — SODIUM CHLORIDE 0.9% FLUSH: 10.0000 mL | Freq: Once | INTRAVENOUS | Status: AC

## 2016-01-02 MED ORDER — SODIUM CHLORIDE 0.9 % IJ SOLN
INTRAMUSCULAR | Status: AC
  Administered 2016-01-02: 5 mL
  Filled 2016-01-02: qty 20

## 2016-01-02 MED ORDER — TRIAMCINOLONE ACETONIDE 40 MG/ML IJ SUSP
40.0000 mg | Freq: Once | INTRAMUSCULAR | Status: AC
  Administered 2016-01-02: 40 mg

## 2016-01-02 MED ORDER — LIDOCAINE HCL (PF) 1 % IJ SOLN
5.0000 mL | Freq: Once | INTRAMUSCULAR | Status: AC
  Administered 2016-01-02: 6 mL via SUBCUTANEOUS

## 2016-01-02 MED ORDER — IOPAMIDOL (ISOVUE-M 200) INJECTION 41%
INTRAMUSCULAR | Status: AC
  Administered 2016-01-02: 2 mL
  Filled 2016-01-02: qty 10

## 2016-01-02 MED ORDER — MIDAZOLAM HCL 2 MG/2ML IJ SOLN
5.0000 mg | Freq: Once | INTRAMUSCULAR | Status: AC
  Administered 2016-01-02: 2 mg via INTRAVENOUS

## 2016-01-02 MED ORDER — ROPIVACAINE HCL 2 MG/ML IJ SOLN
10.0000 mL | Freq: Once | INTRAMUSCULAR | Status: AC
  Administered 2016-01-02: 1 mL via EPIDURAL

## 2016-01-02 NOTE — Progress Notes (Signed)
Patient here for procedure visit and medication management.  Safety precautions to be maintained throughout the outpatient stay will include: orient to surroundings, keep bed in low position, maintain call bell within reach at all times, provide assistance with transfer out of bed and ambulation.
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
## 2016-01-02 NOTE — Patient Instructions (Signed)
Epidural Steroid Injection Patient Information  Description: The epidural space surrounds the nerves as they exit the spinal cord.  In some patients, the nerves can be compressed and inflamed by a bulging disc or a tight spinal canal (spinal stenosis).  By injecting steroids into the epidural space, we can bring irritated nerves into direct contact with a potentially helpful medication.  These steroids act directly on the irritated nerves and can reduce swelling and inflammation which often leads to decreased pain.  Epidural steroids may be injected anywhere along the spine and from the neck to the low back depending upon the location of your pain.   After numbing the skin with local anesthetic (like Novocaine), a small needle is passed into the epidural space slowly.  You may experience a sensation of pressure while this is being done.  The entire block usually last less than 10 minutes.  Conditions which may be treated by epidural steroids:   Low back and leg pain  Neck and arm pain  Spinal stenosis  Post-laminectomy syndrome  Herpes zoster (shingles) pain  Pain from compression fractures  Preparation for the injection:  1. Do not eat any solid food or dairy products within 8 hours of your appointment.  2. You may drink clear liquids up to 3 hours before appointment.  Clear liquids include water, black coffee, juice or soda.  No milk or cream please. 3. You may take your regular medication, including pain medications, with a sip of water before your appointment  Diabetics should hold regular insulin (if taken separately) and take 1/2 normal NPH dos the morning of the procedure.  Carry some sugar containing items with you to your appointment. 4. A driver must accompany you and be prepared to drive you home after your procedure.  5. Bring all your current medications with your. 6. An IV may be inserted and sedation may be given at the discretion of the physician.   7. A blood pressure  cuff, EKG and other monitors will often be applied during the procedure.  Some patients may need to have extra oxygen administered for a short period. 8. You will be asked to provide medical information, including your allergies, prior to the procedure.  We must know immediately if you are taking blood thinners (like Coumadin/Warfarin)  Or if you are allergic to IV iodine contrast (dye). We must know if you could possible be pregnant.  Possible side-effects:  Bleeding from needle site  Infection (rare, may require surgery)  Nerve injury (rare)  Numbness & tingling (temporary)  Difficulty urinating (rare, temporary)  Spinal headache ( a headache worse with upright posture)  Light -headedness (temporary)  Pain at injection site (several days)  Decreased blood pressure (temporary)  Weakness in arm/leg (temporary)  Pressure sensation in back/neck (temporary)  Call if you experience:  Fever/chills associated with headache or increased back/neck pain.  Headache worsened by an upright position.  New onset weakness or numbness of an extremity below the injection site  Hives or difficulty breathing (go to the emergency room)  Inflammation or drainage at the infection site  Severe back/neck pain  Any new symptoms which are concerning to you  Please note:  Although the local anesthetic injected can often make your back or neck feel good for several hours after the injection, the pain will likely return.  It takes 3-7 days for steroids to work in the epidural space.  You may not notice any pain relief for at least that one week.    If effective, we will often do a series of three injections spaced 3-6 weeks apart to maximally decrease your pain.  After the initial series, we generally will wait several months before considering a repeat injection of the same type.  If you have any questions, please call [phone] [facility]  Discharge Instructions  General Discharge Instructions :  If you need to reach your doctor call: Monday-Friday 8:00 am - 4:00 pm at [phone] or toll free [phone].  After clinic hours [phone] to have operator reach doctor.  Bring all of your medication bottles to all your appointments in the pain clinic.  To cancel or reschedule your appointment with Pain Management please remember to call 24 hours in advance to avoid a fee.  Refer to the educational materials which you have been given on: General Risks, I had my Procedure. Discharge Instructions, Post Sedation.  Post Procedure Instructions:  The drugs you were given will stay in your system until tomorrow, so for the next 24 hours you should not drive, make any legal decisions or drink any alcoholic beverages.  You may eat anything you prefer, but it is better to start with liquids then soups and crackers, and gradually work up to solid foods.  Please notify your doctor immediately if you have any unusual bleeding, trouble breathing or pain that is not related to your normal pain.  Depending on the type of procedure that was done, some parts of your body may feel week and/or numb.  This usually clears up by tonight or the next day.  Walk with the use of an assistive device or accompanied by an adult for the 24 hours.  You may use ice on the affected area for the first 24 hours.  Put ice in a Ziploc bag and cover with a towel and place against area 15 minutes on 15 minutes off.  You may switch to heat after 24 hours.GENERAL RISKS AND COMPLICATIONS  What are the risk, side effects and possible complications? Generally speaking, most procedures are safe.  However, with any procedure there are risks, side effects, and the possibility of complications.  The risks and complications are dependent upon the sites that are lesioned, or the type of nerve block to be performed.  The closer the procedure is to the spine, the more  serious the risks are.  Great care is taken when placing the radio frequency needles, block needles or lesioning probes, but sometimes complications can occur. 1. Infection: Any time there is an injection through the skin, there is a risk of infection.  This is why sterile conditions are used for these blocks.  There are four possible types of infection. 1. Localized skin infection. 2. Central Nervous System Infection-This can be in the form of Meningitis, which can be deadly. 3. Epidural Infections-This can be in the form of an epidural abscess, which can cause pressure inside of the spine, causing compression of the spinal cord with subsequent paralysis. This would require an emergency surgery to decompress, and there are no guarantees that the patient would recover from the paralysis. 4. Discitis-This is an infection of the intervertebral discs.  It occurs in about 1% of discography procedures.  It is difficult to treat and it may lead to surgery.        2. Pain: the needles have to go through skin and soft tissues, will cause soreness.       3. Damage to internal structures:  The nerves  to be lesioned may be near blood vessels or    other nerves which can be potentially damaged.       4. Bleeding: Bleeding is more common if the patient is taking blood thinners such as  aspirin, Coumadin, Ticiid, Plavix, etc., or if he/she have some genetic predisposition  such as hemophilia. Bleeding into the spinal canal can cause compression of the spinal  cord with subsequent paralysis.  This would require an emergency surgery to  decompress and there are no guarantees that the patient would recover from the  paralysis.       5. Pneumothorax:  Puncturing of a lung is a possibility, every time a needle is introduced in  the area of the chest or upper back.  Pneumothorax refers to free air around the  collapsed lung(s), inside of the thoracic cavity (chest cavity).  Another two possible  complications related to a  similar event would include: Hemothorax and Chylothorax.   These are variations of the Pneumothorax, where instead of air around the collapsed  lung(s), you may have blood or chyle, respectively.       6. Spinal headaches: They may occur with any procedures in the area of the spine.       7. Persistent CSF (Cerebro-Spinal Fluid) leakage: This is a rare problem, but may occur  with prolonged intrathecal or epidural catheters either due to the formation of a fistulous  track or a dural tear.       8. Nerve damage: By working so close to the spinal cord, there is always a possibility of  nerve damage, which could be as serious as a permanent spinal cord injury with  paralysis.       9. Death:  Although rare, severe deadly allergic reactions known as "Anaphylactic  reaction" can occur to any of the medications used.      10. Worsening of the symptoms:  We can always make thing worse.  What are the chances of something like this happening? Chances of any of this occuring are extremely low.  By statistics, you have more of a chance of getting killed in a motor vehicle accident: while driving to the hospital than any of the above occurring .  Nevertheless, you should be aware that they are possibilities.  In general, it is similar to taking a shower.  Everybody knows that you can slip, hit your head and get killed.  Does that mean that you should not shower again?  Nevertheless always keep in mind that statistics do not mean anything if you happen to be on the wrong side of them.  Even if a procedure has a 1 (one) in a 1,000,000 (million) chance of going wrong, it you happen to be that one..Also, keep in mind that by statistics, you have more of a chance of having something go wrong when taking medications.  Who should not have this procedure? If you are on a blood thinning medication (e.g. Coumadin, Plavix, see list of "Blood Thinners"), or if you have an active infection going on, you should not have the  procedure.  If you are taking any blood thinners, please inform your physician.  How should I prepare for this procedure?  Do not eat or drink anything at least six hours prior to the procedure.  Bring a driver with you .  It cannot be a taxi.  Come accompanied by an adult that can drive you back, and that is strong enough to help you if  your legs get weak or numb from the local anesthetic.  Take all of your medicines the morning of the procedure with just enough water to swallow them.  If you have diabetes, make sure that you are scheduled to have your procedure done first thing in the morning, whenever possible.  If you have diabetes, take only half of your insulin dose and notify our nurse that you have done so as soon as you arrive at the clinic.  If you are diabetic, but only take blood sugar pills (oral hypoglycemic), then do not take them on the morning of your procedure.  You may take them after you have had the procedure.  Do not take aspirin or any aspirin-containing medications, at least eleven (11) days prior to the procedure.  They may prolong bleeding.  Wear loose fitting clothing that may be easy to take off and that you would not mind if it got stained with Betadine or blood.  Do not wear any jewelry or perfume  Remove any nail coloring.  It will interfere with some of our monitoring equipment.  NOTE: Remember that this is not meant to be interpreted as a complete list of all possible complications.  Unforeseen problems may occur.  BLOOD THINNERS The following drugs contain aspirin or other products, which can cause increased bleeding during surgery and should not be taken for 2 weeks prior to and 1 week after surgery.  If you should need take something for relief of minor pain, you may take acetaminophen which is found in Tylenol,m Datril, Anacin-3 and Panadol. It is not blood thinner. The products listed below are.  Do not take any of the products listed below in  addition to any listed on your instruction sheet.  A.P.C or A.P.C with Codeine Codeine Phosphate Capsules #3 Ibuprofen Ridaura  ABC compound Congesprin Imuran rimadil  Advil Cope Indocin Robaxisal  Alka-Seltzer Effervescent Pain Reliever and Antacid Coricidin or Coricidin-D  Indomethacin Rufen  Alka-Seltzer plus Cold Medicine Cosprin Ketoprofen S-A-C Tablets  Anacin Analgesic Tablets or Capsules Coumadin Korlgesic Salflex  Anacin Extra Strength Analgesic tablets or capsules CP-2 Tablets Lanoril Salicylate  Anaprox Cuprimine Capsules Levenox Salocol  Anexsia-D Dalteparin Magan Salsalate  Anodynos Darvon compound Magnesium Salicylate Sine-off  Ansaid Dasin Capsules Magsal Sodium Salicylate  Anturane Depen Capsules Marnal Soma  APF Arthritis pain formula Dewitt's Pills Measurin Stanback  Argesic Dia-Gesic Meclofenamic Sulfinpyrazone  Arthritis Bayer Timed Release Aspirin Diclofenac Meclomen Sulindac  Arthritis pain formula Anacin Dicumarol Medipren Supac  Analgesic (Safety coated) Arthralgen Diffunasal Mefanamic Suprofen  Arthritis Strength Bufferin Dihydrocodeine Mepro Compound Suprol  Arthropan liquid Dopirydamole Methcarbomol with Aspirin Synalgos  ASA tablets/Enseals Disalcid Micrainin Tagament  Ascriptin Doan's Midol Talwin  Ascriptin A/D Dolene Mobidin Tanderil  Ascriptin Extra Strength Dolobid Moblgesic Ticlid  Ascriptin with Codeine Doloprin or Doloprin with Codeine Momentum Tolectin  Asperbuf Duoprin Mono-gesic Trendar  Aspergum Duradyne Motrin or Motrin IB Triminicin  Aspirin plain, buffered or enteric coated Durasal Myochrisine Trigesic  Aspirin Suppositories Easprin Nalfon Trillsate  Aspirin with Codeine Ecotrin Regular or Extra Strength Naprosyn Uracel  Atromid-S Efficin Naproxen Ursinus  Auranofin Capsules Elmiron Neocylate Vanquish  Axotal Emagrin Norgesic Verin  Azathioprine Empirin or Empirin with Codeine Normiflo Vitamin E  Azolid Emprazil Nuprin Voltaren  Bayer  Aspirin plain, buffered or children's or timed BC Tablets or powders Encaprin Orgaran Warfarin Sodium  Buff-a-Comp Enoxaparin Orudis Zorpin  Buff-a-Comp with Codeine Equegesic Os-Cal-Gesic   Buffaprin Excedrin plain, buffered or Extra Strength Oxalid   Bufferin Arthritis   Strength Feldene Oxphenbutazone   Bufferin plain or Extra Strength Feldene Capsules Oxycodone with Aspirin   Bufferin with Codeine Fenoprofen Fenoprofen Pabalate or Pabalate-SF   Buffets II Flogesic Panagesic   Buffinol plain or Extra Strength Florinal or Florinal with Codeine Panwarfarin   Buf-Tabs Flurbiprofen Penicillamine   Butalbital Compound Four-way cold tablets Penicillin   Butazolidin Fragmin Pepto-Bismol   Carbenicillin Geminisyn Percodan   Carna Arthritis Reliever Geopen Persantine   Carprofen Gold's salt Persistin   Chloramphenicol Goody's Phenylbutazone   Chloromycetin Haltrain Piroxlcam   Clmetidine heparin Plaquenil   Cllnoril Hyco-pap Ponstel   Clofibrate Hydroxy chloroquine Propoxyphen         Before stopping any of these medications, be sure to consult the physician who ordered them.  Some, such as Coumadin (Warfarin) are ordered to prevent or treat serious conditions such as "deep thrombosis", "pumonary embolisms", and other heart problems.  The amount of time that you may need off of the medication may also vary with the medication and the reason for which you were taking it.  If you are taking any of these medications, please make sure you notify your pain physician before you undergo any procedures.   Hydrocodone prescription given

## 2016-01-03 ENCOUNTER — Telehealth: Payer: Self-pay | Admitting: *Deleted

## 2016-01-03 NOTE — Telephone Encounter (Signed)
Spoke with patient re; procedure on yesterday.  States she is still hurting, explained that she will need to give the steroids about a week to do their job.  Patient states she did not ger her AVS printout.  Next appt time given to patient, she states she has another appt on that day and will need to change it. Transferred to front desk for different appt time.

## 2016-01-10 LAB — TOXASSURE SELECT 13 (MW), URINE: PDF: 0

## 2016-01-17 ENCOUNTER — Telehealth: Payer: Self-pay

## 2016-01-17 NOTE — Telephone Encounter (Signed)
Per Hamilton Capri, RN,  Patient notified to call us on July 7th so that we can have her script ready for pickup on 02-04-16.  Script is in basket at [facility] for pickup on July 10th.  Dr Pernell Dupre aware.

## 2016-01-17 NOTE — Telephone Encounter (Signed)
Asking about medications. Meds are scheduled to last until 02-04-16. Her appointment is not until 02-12-16. Will Dr. Pernell Dupre write them outside of the appointment?

## 2016-01-17 NOTE — Telephone Encounter (Signed)
Pt is having pain in her lower back and neck area that goes down to her legs. Pt says she feels worse since she got the second procedure.

## 2016-01-31 ENCOUNTER — Telehealth: Payer: Self-pay | Admitting: Anesthesiology

## 2016-01-31 NOTE — Telephone Encounter (Signed)
Patient needs meds refilled, was on for 02-04-16 but had to resched due to conflicting appts, moved appt to 02-06-16 then was canceled by office per dr adams request to keep appointments to limit he requested. Says she was told dr Pernell Dupre would leave a script here. No script at front desk, please call patient and let her know solution Patient was informed Dr. Pernell Dupre would not be in office until Monday 02-04-16 but she wants a call today.

## 2016-01-31 NOTE — Telephone Encounter (Signed)
Patient notified script is here and ready to be picked up.

## 2016-02-04 ENCOUNTER — Ambulatory Visit: Payer: Medicaid Other | Admitting: Anesthesiology

## 2016-02-05 ENCOUNTER — Ambulatory Visit: Payer: Disability Insurance | Attending: Preventative Medicine

## 2016-02-05 DIAGNOSIS — J449 Chronic obstructive pulmonary disease, unspecified: Secondary | ICD-10-CM | POA: Insufficient documentation

## 2016-02-05 DIAGNOSIS — M542 Cervicalgia: Secondary | ICD-10-CM

## 2016-02-05 DIAGNOSIS — J439 Emphysema, unspecified: Secondary | ICD-10-CM

## 2016-02-05 MED ORDER — ALBUTEROL SULFATE (2.5 MG/3ML) 0.083% IN NEBU
2.5000 mg | INHALATION_SOLUTION | Freq: Once | RESPIRATORY_TRACT | Status: AC
  Administered 2016-02-05: 2.5 mg via RESPIRATORY_TRACT
  Filled 2016-02-05: qty 3

## 2016-02-06 ENCOUNTER — Ambulatory Visit: Payer: Medicaid Other | Admitting: Anesthesiology

## 2016-02-07 ENCOUNTER — Other Ambulatory Visit: Payer: Self-pay | Admitting: Preventative Medicine

## 2016-02-07 DIAGNOSIS — J439 Emphysema, unspecified: Secondary | ICD-10-CM

## 2016-02-12 ENCOUNTER — Encounter: Payer: Self-pay | Admitting: Anesthesiology

## 2016-02-12 ENCOUNTER — Ambulatory Visit: Payer: Disability Insurance | Attending: Anesthesiology | Admitting: Anesthesiology

## 2016-02-12 VITALS — BP 100/63 | HR 91 | Temp 98.4°F | Resp 20 | Ht 63.0 in | Wt 184.0 lb

## 2016-02-12 DIAGNOSIS — M51369 Other intervertebral disc degeneration, lumbar region without mention of lumbar back pain or lower extremity pain: Secondary | ICD-10-CM

## 2016-02-12 DIAGNOSIS — M5136 Other intervertebral disc degeneration, lumbar region: Secondary | ICD-10-CM

## 2016-02-12 DIAGNOSIS — R52 Pain, unspecified: Secondary | ICD-10-CM | POA: Diagnosis not present

## 2016-02-12 DIAGNOSIS — M5416 Radiculopathy, lumbar region: Secondary | ICD-10-CM

## 2016-02-12 DIAGNOSIS — M5030 Other cervical disc degeneration, unspecified cervical region: Secondary | ICD-10-CM | POA: Diagnosis not present

## 2016-02-12 DIAGNOSIS — M5417 Radiculopathy, lumbosacral region: Secondary | ICD-10-CM | POA: Diagnosis not present

## 2016-02-12 MED ORDER — HYDROCODONE-ACETAMINOPHEN 5-325 MG PO TABS: 1.0000 | ORAL_TABLET | Freq: Four times a day (QID) | ORAL | Status: DC | PRN

## 2016-02-12 NOTE — Progress Notes (Signed)
Safety precautions to be maintained throughout the outpatient stay will include: orient to surroundings, keep bed in low position, maintain call bell within reach at all times, provide assistance with transfer out of bed and ambulation.  

## 2016-02-12 NOTE — Patient Instructions (Signed)
Smoking Cessation, Tips for Success If you are ready to quit smoking, congratulations! You have chosen to help yourself be healthier. Cigarettes bring nicotine, tar, carbon monoxide, and other irritants into your body. Your lungs, heart, and blood vessels will be able to work better without these poisons. There are many different ways to quit smoking. Nicotine gum, nicotine patches, a nicotine inhaler, or nicotine nasal spray can help with physical craving. Hypnosis, support groups, and medicines help break the habit of smoking. WHAT THINGS CAN I DO TO MAKE QUITTING EASIER?  Here are some tips to help you quit for good:  Pick a date when you will quit smoking completely. Tell all of your friends and family about your plan to quit on that date.  Do not try to slowly cut down on the number of cigarettes you are smoking. Pick a quit date and quit smoking completely starting on that day.  Throw away all cigarettes.   Clean and remove all ashtrays from your home, work, and car.  On a card, write down your reasons for quitting. Carry the card with you and read it when you get the urge to smoke.  Cleanse your body of nicotine. Drink enough water and fluids to keep your urine clear or pale yellow. Do this after quitting to flush the nicotine from your body.  Learn to predict your moods. Do not let a bad situation be your excuse to have a cigarette. Some situations in your life might tempt you into wanting a cigarette.  Never have "just one" cigarette. It leads to wanting another and another. Remind yourself of your decision to quit.  Change habits associated with smoking. If you smoked while driving or when feeling stressed, try other activities to replace smoking. Stand up when drinking your coffee. Brush your teeth after eating. Sit in a different chair when you read the paper. Avoid alcohol while trying to quit, and try to drink fewer caffeinated beverages. Alcohol and caffeine may urge you to  smoke.  Avoid foods and drinks that can trigger a desire to smoke, such as sugary or spicy foods and alcohol.  Ask people who smoke not to smoke around you.  Have something planned to do right after eating or having a cup of coffee. For example, plan to take a walk or exercise.  Try a relaxation exercise to calm you down and decrease your stress. Remember, you may be tense and nervous for the first 2 weeks after you quit, but this will pass.  Find new activities to keep your hands busy. Play with a pen, coin, or rubber band. Doodle or draw things on paper.  Brush your teeth right after eating. This will help cut down on the craving for the taste of tobacco after meals. You can also try mouthwash.   Use oral substitutes in place of cigarettes. Try using lemon drops, carrots, cinnamon sticks, or chewing gum. Keep them handy so they are available when you have the urge to smoke.  When you have the urge to smoke, try deep breathing.  Designate your home as a nonsmoking area.  If you are a heavy smoker, ask your health care provider about a prescription for nicotine chewing gum. It can ease your withdrawal from nicotine.  Reward yourself. Set aside the cigarette money you save and buy yourself something nice.  Look for support from others. Join a support group or smoking cessation program. Ask someone at home or at work to help you with your plan   to quit smoking.  Always ask yourself, "Do I need this cigarette or is this just a reflex?" Tell yourself, "Today, I choose not to smoke," or "I do not want to smoke." You are reminding yourself of your decision to quit.  Do not replace cigarette smoking with electronic cigarettes (commonly called e-cigarettes). The safety of e-cigarettes is unknown, and some may contain harmful chemicals.  If you relapse, do not give up! Plan ahead and think about what you will do the next time you get the urge to smoke. HOW WILL I FEEL WHEN I QUIT SMOKING? You  may have symptoms of withdrawal because your body is used to nicotine (the addictive substance in cigarettes). You may crave cigarettes, be irritable, feel very hungry, cough often, get headaches, or have difficulty concentrating. The withdrawal symptoms are only temporary. They are strongest when you first quit but will go away within 10-14 days. When withdrawal symptoms occur, stay in control. Think about your reasons for quitting. Remind yourself that these are signs that your body is healing and getting used to being without cigarettes. Remember that withdrawal symptoms are easier to treat than the major diseases that smoking can cause.  Even after the withdrawal is over, expect periodic urges to smoke. However, these cravings are generally short lived and will go away whether you smoke or not. Do not smoke! WHAT RESOURCES ARE AVAILABLE TO HELP ME QUIT SMOKING? Your health care provider can direct you to community resources or hospitals for support, which may include:  Group support.  Education.  Hypnosis.  Therapy.   This information is not intended to replace advice given to you by your health care provider. Make sure you discuss any questions you have with your health care provider.   Document Released: 04/11/2004 Document Revised: 08/04/2014 Document Reviewed: 12/30/2012 [employer] Interactive Patient Education 2016 [employer]  
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
## 2016-02-26 ENCOUNTER — Ambulatory Visit: Payer: Disability Insurance | Attending: Preventative Medicine

## 2016-02-26 DIAGNOSIS — J439 Emphysema, unspecified: Secondary | ICD-10-CM | POA: Diagnosis present

## 2016-03-11 ENCOUNTER — Encounter: Payer: Self-pay | Admitting: Anesthesiology

## 2016-03-11 ENCOUNTER — Ambulatory Visit: Payer: Medicaid Other | Attending: Anesthesiology | Admitting: Anesthesiology

## 2016-03-11 VITALS — BP 133/94 | HR 94 | Temp 98.0°F | Resp 16 | Ht 63.0 in | Wt 174.0 lb

## 2016-03-11 DIAGNOSIS — M5136 Other intervertebral disc degeneration, lumbar region: Secondary | ICD-10-CM

## 2016-03-11 DIAGNOSIS — K589 Irritable bowel syndrome without diarrhea: Secondary | ICD-10-CM | POA: Insufficient documentation

## 2016-03-11 DIAGNOSIS — M5030 Other cervical disc degeneration, unspecified cervical region: Secondary | ICD-10-CM | POA: Diagnosis not present

## 2016-03-11 DIAGNOSIS — K219 Gastro-esophageal reflux disease without esophagitis: Secondary | ICD-10-CM | POA: Insufficient documentation

## 2016-03-11 DIAGNOSIS — F418 Other specified anxiety disorders: Secondary | ICD-10-CM | POA: Diagnosis not present

## 2016-03-11 DIAGNOSIS — R52 Pain, unspecified: Secondary | ICD-10-CM | POA: Insufficient documentation

## 2016-03-11 DIAGNOSIS — M5116 Intervertebral disc disorders with radiculopathy, lumbar region: Secondary | ICD-10-CM | POA: Insufficient documentation

## 2016-03-11 DIAGNOSIS — M5416 Radiculopathy, lumbar region: Secondary | ICD-10-CM

## 2016-03-11 DIAGNOSIS — J449 Chronic obstructive pulmonary disease, unspecified: Secondary | ICD-10-CM | POA: Diagnosis not present

## 2016-03-11 DIAGNOSIS — M542 Cervicalgia: Secondary | ICD-10-CM

## 2016-03-11 DIAGNOSIS — M5417 Radiculopathy, lumbosacral region: Secondary | ICD-10-CM

## 2016-03-11 DIAGNOSIS — Z8249 Family history of ischemic heart disease and other diseases of the circulatory system: Secondary | ICD-10-CM | POA: Insufficient documentation

## 2016-03-11 DIAGNOSIS — M545 Low back pain: Secondary | ICD-10-CM | POA: Diagnosis not present

## 2016-03-11 DIAGNOSIS — K7460 Unspecified cirrhosis of liver: Secondary | ICD-10-CM | POA: Diagnosis not present

## 2016-03-11 DIAGNOSIS — J45909 Unspecified asthma, uncomplicated: Secondary | ICD-10-CM | POA: Diagnosis not present

## 2016-03-11 DIAGNOSIS — F17210 Nicotine dependence, cigarettes, uncomplicated: Secondary | ICD-10-CM | POA: Insufficient documentation

## 2016-03-11 DIAGNOSIS — M069 Rheumatoid arthritis, unspecified: Secondary | ICD-10-CM | POA: Diagnosis not present

## 2016-03-11 DIAGNOSIS — M51369 Other intervertebral disc degeneration, lumbar region without mention of lumbar back pain or lower extremity pain: Secondary | ICD-10-CM

## 2016-03-11 DIAGNOSIS — G4730 Sleep apnea, unspecified: Secondary | ICD-10-CM | POA: Insufficient documentation

## 2016-03-11 DIAGNOSIS — G8929 Other chronic pain: Secondary | ICD-10-CM | POA: Diagnosis not present

## 2016-03-11 DIAGNOSIS — G5600 Carpal tunnel syndrome, unspecified upper limb: Secondary | ICD-10-CM | POA: Insufficient documentation

## 2016-03-11 DIAGNOSIS — M549 Dorsalgia, unspecified: Secondary | ICD-10-CM | POA: Diagnosis present

## 2016-03-11 DIAGNOSIS — M47812 Spondylosis without myelopathy or radiculopathy, cervical region: Secondary | ICD-10-CM

## 2016-03-11 MED ORDER — HYDROCODONE-ACETAMINOPHEN 5-325 MG PO TABS: 1.0000 | ORAL_TABLET | Freq: Four times a day (QID) | ORAL | 0 refills | Status: DC | PRN

## 2016-03-11 NOTE — Progress Notes (Signed)
Safety precautions to be maintained throughout the outpatient stay will include: orient to surroundings, keep bed in low position, maintain call bell within reach at all times, provide assistance with transfer out of bed and ambulation.   Pt here today for eval- states she had pnuem 2 weeks ago
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
## 2016-04-04 ENCOUNTER — Emergency Department
Admission: EM | Admit: 2016-04-04 | Discharge: 2016-04-04 | Disposition: A | Discharge status: Home / Self Care | Payer: Medicaid Other | Attending: Emergency Medicine | Admitting: Emergency Medicine

## 2016-04-04 ENCOUNTER — Encounter: Payer: Self-pay | Admitting: Emergency Medicine

## 2016-04-04 ENCOUNTER — Emergency Department: Payer: Medicaid Other

## 2016-04-04 DIAGNOSIS — Y939 Activity, unspecified: Secondary | ICD-10-CM | POA: Diagnosis not present

## 2016-04-04 DIAGNOSIS — M79605 Pain in left leg: Secondary | ICD-10-CM | POA: Insufficient documentation

## 2016-04-04 DIAGNOSIS — W19XXXA Unspecified fall, initial encounter: Secondary | ICD-10-CM

## 2016-04-04 DIAGNOSIS — M79641 Pain in right hand: Secondary | ICD-10-CM | POA: Insufficient documentation

## 2016-04-04 DIAGNOSIS — W1839XA Other fall on same level, initial encounter: Secondary | ICD-10-CM | POA: Diagnosis not present

## 2016-04-04 DIAGNOSIS — F17210 Nicotine dependence, cigarettes, uncomplicated: Secondary | ICD-10-CM | POA: Insufficient documentation

## 2016-04-04 DIAGNOSIS — M542 Cervicalgia: Secondary | ICD-10-CM | POA: Insufficient documentation

## 2016-04-04 DIAGNOSIS — M7918 Myalgia, other site: Secondary | ICD-10-CM

## 2016-04-04 DIAGNOSIS — Y92512 Supermarket, store or market as the place of occurrence of the external cause: Secondary | ICD-10-CM | POA: Diagnosis not present

## 2016-04-04 DIAGNOSIS — Z79899 Other long term (current) drug therapy: Secondary | ICD-10-CM | POA: Insufficient documentation

## 2016-04-04 DIAGNOSIS — J449 Chronic obstructive pulmonary disease, unspecified: Secondary | ICD-10-CM | POA: Insufficient documentation

## 2016-04-04 DIAGNOSIS — Y999 Unspecified external cause status: Secondary | ICD-10-CM | POA: Insufficient documentation

## 2016-04-04 DIAGNOSIS — S3992XA Unspecified injury of lower back, initial encounter: Secondary | ICD-10-CM | POA: Diagnosis present

## 2016-04-04 DIAGNOSIS — S300XXA Contusion of lower back and pelvis, initial encounter: Secondary | ICD-10-CM | POA: Insufficient documentation

## 2016-04-04 DIAGNOSIS — M79604 Pain in right leg: Secondary | ICD-10-CM | POA: Diagnosis not present

## 2016-04-04 MED ORDER — ORPHENADRINE CITRATE 30 MG/ML IJ SOLN
60.0000 mg | Freq: Two times a day (BID) | INTRAMUSCULAR | Status: DC
  Administered 2016-04-04: 60 mg via INTRAMUSCULAR
  Filled 2016-04-04: qty 2

## 2016-04-04 MED ORDER — DIAZEPAM 2 MG PO TABS: 2.0000 mg | ORAL_TABLET | Freq: Three times a day (TID) | ORAL | 0 refills | Status: AC | PRN

## 2016-04-04 MED ORDER — NAPROXEN 500 MG PO TABS: 500.0000 mg | ORAL_TABLET | Freq: Two times a day (BID) | ORAL | 0 refills | Status: AC

## 2016-04-04 MED ORDER — OXYCODONE HCL 5 MG PO TABS
5.0000 mg | ORAL_TABLET | Freq: Once | ORAL | Status: AC
  Administered 2016-04-04: 5 mg via ORAL
  Filled 2016-04-04: qty 1

## 2016-04-04 NOTE — ED Provider Notes (Signed)
[facility] Emergency Department Provider Note ____________________________________________  Time seen: Approximately 3:19 PM  I have reviewed the triage vital signs and the nursing notes.   HISTORY  Chief Complaint Fall    HPI Lisa Stout is a 55 y.o. female who presents to the emergency department for evaluationof pain from her "feet to her neck" after falling backward at [facility] on September 1. She denies loss of consciousness during or after the fall. She states that she is a chronic pain patient and sees Dr. Pernell Dupre. She states that she has been awaiting a return call from his office to find out what to do. She states that she has taken extra Vicodin due to the acute pain. She is now out of Vicodin. She states that he called her this morning and told her to come to the emergency department for evaluation. She has a follow-up appointment with him on the 13th for her medication refill.  Past Medical History:  Diagnosis Date  . Arthritis   . Carpal tunnel syndrome   . Carpal tunnel syndrome 2015   bilateral  . Cirrhosis of liver (HCC) 2006  . COPD (chronic obstructive pulmonary disease) (HCC)   . Renal disorder   . Respiratory abnormalities    Pneumonia    There are no active problems to display for this patient.   Past Surgical History:  Procedure Laterality Date  . ABDOMINAL HYSTERECTOMY    . APPENDECTOMY    . HAND SURGERY Left 2014    Prior to Admission medications   Medication Sig Start Date End Date Taking? Authorizing Provider  albuterol (PROVENTIL HFA;VENTOLIN HFA) 108 (90 Base) MCG/ACT inhaler Inhale 2 puffs into the lungs every 4 (four) hours as needed for wheezing or shortness of breath.    Historical Provider, MD  ALPRAZolam Prudy Feeler) 0.25 MG tablet Take 0.25 mg by mouth at bedtime.    Historical Provider, MD  benzonatate (TESSALON PERLES) 100 MG capsule Take 1 capsule (100 mg total) by mouth every 6 (six) hours as needed for  cough. Patient not taking: Reported on 03/11/2016 06/04/15 06/03/16  Rockne Menghini, MD  budesonide-formoterol [facility]) 160-4.5 MCG/ACT inhaler Inhale 2 puffs into the lungs 1 day or 1 dose.    Historical Provider, MD  docusate sodium (COLACE) 100 MG capsule Take 100 mg by mouth daily.    Historical Provider, MD  folic acid (FOLVITE) 1 MG tablet Take 1 mg by mouth daily.    Historical Provider, MD  HYDROcodone-acetaminophen (NORCO/VICODIN) 5-325 MG tablet Take 1 tablet by mouth every 6 (six) hours as needed for moderate pain or severe pain. 03/11/16   Yevette Edwards, MD  ipratropium-albuterol (DUONEB) 0.5-2.5 (3) MG/3ML SOLN Take 3 mLs by nebulization every 4 (four) hours as needed.    Historical Provider, MD  omeprazole (PRILOSEC) 40 MG capsule Take 40 mg by mouth daily. Reported on 01/02/2016    Historical Provider, MD  predniSONE (DELTASONE) 20 MG tablet Take 3 tablets (60 mg total) by mouth daily. 06/04/15 06/03/16  Anne-Caroline Sharma Covert, MD  zolpidem (AMBIEN) 10 MG tablet Take 10 mg by mouth at bedtime as needed for sleep.    Historical Provider, MD    Allergies Penicillins; Codeine; Sulfa antibiotics; and Tramadol  Family History  Problem Relation Age of Onset  . Heart disease Mother   . Heart disease Father   . Mental illness Brother   . Heart disease Brother   . Heart disease Brother   . Heart disease Brother   .  Heart disease Brother     Social History Social History  Substance Use Topics  . Smoking status: Current Some Day Smoker    Packs/day: 0.50    Types: Cigarettes  . Smokeless tobacco: Not on file  . Alcohol use No    Review of Systems Constitutional: No recent illness. Cardiovascular: Denies chest pain or palpitations. Respiratory: Denies shortness of breath. Musculoskeletal: Pain in Neck, back, legs and feet, and right hand and wrist. Skin: Negative for rash, wound, lesion. Neurological: Negative for focal weakness or  numbness.  ____________________________________________   PHYSICAL EXAM:  VITAL SIGNS: ED Triage Vitals [04/04/16 1504]  Enc Vitals Group     BP (!) 134/106     Pulse Rate 81     Resp 16     Temp 98.6 F (37 C)     Temp Source Oral     SpO2 95 %     Weight 174 lb (78.9 kg)     Height 5\' 4"  (1.626 m)     Head Circumference      Peak Flow      Pain Score      Pain Loc      Pain Edu?      Excl. in GC?     Constitutional: Alert and oriented. Well appearing and in no acute distress. Eyes: Conjunctivae are normal. EOMI. Head: Atraumatic. Neck: No stridor.  Respiratory: Normal respiratory effort.   Musculoskeletal: Full range of motion throughout. Tenderness to palpation over the sacrum and coccyx. Wrist is tender to palpation throughout without noted deformity. Neurologic:  Normal speech and language. No gross focal neurologic deficits are appreciated. Speech is normal. No gait instability. Skin:  Skin is warm, dry and intact. Atraumatic. Psychiatric: Mood and affect are normal. Speech and behavior are normal.  ____________________________________________   LABS (all labs ordered are listed, but only abnormal results are displayed)  Labs Reviewed - No data to display ____________________________________________  RADIOLOGY  Lumbar spine and sacrum and coccyx images are negative for acute bony abnormality. ____________________________________________   PROCEDURES  Procedure(s) performed: None   ____________________________________________   INITIAL IMPRESSION / ASSESSMENT AND PLAN / ED COURSE  Clinical Course    Pertinent labs & imaging results that were available during my care of the patient were reviewed by me and considered in my medical decision making (see chart for details).  Patient was encouraged to continue using her walker. She is given a prescription for Valium 2 mg and Naprosyn. She was advised that she will need to discuss a refill on her  Vicodin with Dr. . Should her last refill was 03/11/16, so she has ran out of her medications currently. She was advised that the ER does not give refills for chronic pain medications. She was advised to return to the emergency department for symptoms that change or worsen if she is unable schedule an appointment. ____________________________________________   FINAL CLINICAL IMPRESSION(S) / ED DIAGNOSES  Final diagnoses:  None       03/13/16, FNP 04/04/16 1731    06/04/16, MD 04/04/16 2259

## 2016-04-04 NOTE — ED Triage Notes (Addendum)
Patient was at [facility] and fell backward on 03-28-16. Patient is seen by Dr. Pernell Dupre at the pain clinic for Chronic Pain. Patient contacted Dr. Pernell Dupre after 1 week and was instructed to come to the ED for pain control and evaluation. Patient normally ambulates with a walker and has been ambulatory today.   Patient states "I'm out of my Vicodin"

## 2016-04-09 ENCOUNTER — Ambulatory Visit: Payer: Medicaid Other | Attending: Anesthesiology | Admitting: Anesthesiology

## 2016-04-09 ENCOUNTER — Encounter: Payer: Self-pay | Admitting: Anesthesiology

## 2016-04-09 VITALS — BP 119/95 | HR 90 | Temp 97.9°F | Resp 16 | Ht 64.0 in | Wt 174.0 lb

## 2016-04-09 DIAGNOSIS — M5431 Sciatica, right side: Secondary | ICD-10-CM | POA: Diagnosis not present

## 2016-04-09 DIAGNOSIS — G8929 Other chronic pain: Secondary | ICD-10-CM | POA: Diagnosis not present

## 2016-04-09 DIAGNOSIS — M5432 Sciatica, left side: Secondary | ICD-10-CM

## 2016-04-09 DIAGNOSIS — M47817 Spondylosis without myelopathy or radiculopathy, lumbosacral region: Secondary | ICD-10-CM

## 2016-04-09 DIAGNOSIS — M5136 Other intervertebral disc degeneration, lumbar region: Secondary | ICD-10-CM

## 2016-04-09 MED ORDER — HYDROCODONE-ACETAMINOPHEN 5-325 MG PO TABS: 1.0000 | ORAL_TABLET | Freq: Four times a day (QID) | ORAL | 0 refills | Status: DC | PRN

## 2016-04-09 NOTE — Progress Notes (Signed)
Subjective:  Patient ID: Lisa Stout, female    DOB: [birth year]  Age: 55 y.o. MRN: [id number]  CC: Back Pain (low and goes all the way up back.) and Knee Pain (bilateral)   Procedure: None Previous Procedure: Lumbar epidural steroid No.2 at L3-L4 with moderate sedation and fluoroscopic guidance Previous injection at L5-S1 and without significant improvement  HPI Lisa Stout presents for re evaluation today.She continues to complain of low back pain of the same quality characteristic and distribution. Primarily centralized low back with occasional radiation down the legs as before With pain that radiates into the bilateral hips and buttocks.. She's taking her medications as prescribed and occasionally taking an extra half tablet 2 tablet per day for low back spasming.   She was recently seen in the ER for some back spasming and she did sustain a fall. She was diagnosed with a myofascial contusion of the low back and buttock region. She was given Valium for the spasming.  History Lisa Stout has a past medical history of Arthritis; Carpal tunnel syndrome; Carpal tunnel syndrome (2015); Cirrhosis of liver (HCC) (2006); COPD (chronic obstructive pulmonary disease) (HCC); Renal disorder; and Respiratory abnormalities.   She has a past surgical history that includes Hand surgery (Left, 2014); Abdominal hysterectomy; and Appendectomy.   Her family history includes Heart disease in her brother, brother, brother, brother, father, and mother; Mental illness in her brother.She reports that she has been smoking Cigarettes.  She has been smoking about 0.50 packs per day. She has never used smokeless tobacco. She reports that she does not drink alcohol. Her drug history is not on file.   ---------------------------------------------------------------------------------------------------------------------- Past Medical History:  Diagnosis Date  . Arthritis   . Carpal tunnel syndrome   . Carpal tunnel  syndrome 2015   bilateral  . Cirrhosis of liver (HCC) 2006  . COPD (chronic obstructive pulmonary disease) (HCC)   . Renal disorder   . Respiratory abnormalities    Pneumonia    Past Surgical History:  Procedure Laterality Date  . ABDOMINAL HYSTERECTOMY    . APPENDECTOMY    . HAND SURGERY Left 2014    Family History  Problem Relation Age of Onset  . Heart disease Mother   . Heart disease Father   . Mental illness Brother   . Heart disease Brother   . Heart disease Brother   . Heart disease Brother   . Heart disease Brother     Social History  Substance Use Topics  . Smoking status: Current Some Day Smoker    Packs/day: 0.50    Types: Cigarettes  . Smokeless tobacco: Never Used  . Alcohol use No    ---------------------------------------------------------------------------------------------------------------------- Social History   Social History  . Marital status: Single    Spouse name: N/A  . Number of children: N/A  . Years of education: N/A   Social History Main Topics  . Smoking status: Current Some Day Smoker    Packs/day: 0.50    Types: Cigarettes  . Smokeless tobacco: Never Used  . Alcohol use No  . Drug use: Unknown  . Sexual activity: Not Asked   Other Topics Concern  . None   Social History Narrative  . None      ----------------------------------------------------------------------------------------------------------------------  ROS Review of Systems   Objective:  BP (!) 119/95 (BP Location: Right Arm, Patient Position: Sitting, Cuff Size: Large)   Pulse 90   Temp 97.9 F (36.6 C) (Oral)   Resp 16   Ht 5\' 4"  (1.626 m)  Wt 174 lb (78.9 kg)   SpO2 99%   BMI 29.87 kg/m   Physical Exam  Patient is a reasonable historian alert oriented cooperative compliant Extraocular muscles are intact pupils equally round reactive to light Heart regular rate and rhythm without murmur Lungs with distant breath sounds and bilateral  expiratory wheezing Low back reveals bilateral paraspinous muscle tenderness with pain on extension and bilateral rotation in the standing position. Extension of the low back with right and left lateral rotation does Yield reproduction of back pain she is complaining of. In the Supine position she has a positive straight leg raise right side.   Assessment & Plan:   Lisa Stout was seen today for back pain and knee pain.  Diagnoses and all orders for this visit:  DDD (degenerative disc disease), lumbar -     LUMBAR FACET(MEDIAL BRANCH NERVE BLOCK) MBNB  Chronic pain -     LUMBAR FACET(MEDIAL BRANCH NERVE BLOCK) MBNB -     ToxASSURE Select 13 (MW), Urine  Bilateral sciatica  Other orders -     HYDROcodone-acetaminophen (NORCO/VICODIN) 5-325 MG tablet; Take 1 tablet by mouth every 6 (six) hours as needed for moderate pain or severe pain.     ----------------------------------------------------------------------------------------------------------------------  Problem List Items Addressed This Visit    None    Visit Diagnoses    DDD (degenerative disc disease), lumbar    -  Primary   Relevant Medications   HYDROcodone-acetaminophen (NORCO/VICODIN) 5-325 MG tablet   Other Relevant Orders   LUMBAR FACET(MEDIAL BRANCH NERVE BLOCK) MBNB   Chronic pain       Relevant Medications   HYDROcodone-acetaminophen (NORCO/VICODIN) 5-325 MG tablet   Other Relevant Orders   LUMBAR FACET(MEDIAL BRANCH NERVE BLOCK) MBNB   ToxASSURE Select 13 (MW), Urine   Bilateral sciatica          ----------------------------------------------------------------------------------------------------------------------  1. Cervicalgia This seems to be better so we will defer on further management at this time. We have encouraged her to continue with stretching strengthening exercises for her upper extremity function and posture control.  2. DDD (degenerative disc disease), lumbar I'm going to defer on any  repeat injection today as she is failed to gain any significant improvement. Based on the chronicity of her pain I am requesting an MRI to further evaluate her low back for underlying pathology. This is scheduled tomorrow. She continues to have chronic low back pain with radiating pain to the lower extremities but no evidence of bowel or bladder dysfunction. Furthermore we will refill her medications today and she has been compliant with her therapy however she is requesting an increase in her medication and this is been denied.  3. DDD (degenerative disc disease), cervical Continue physical therapy exercises  4. Right lumbar radiculitis Continue with physical therapy and she may be a candidate for a facet block for diagnostic purpose in the future and we will continue her current medication management.  5. Complaints of total body pain Continue Naprosyn.  6. DJD (degenerative joint disease), cervical  7. Tox assure reveals a previous positive tox screen for cocaine. Her most recent tox screen was negative for this.  8. Lumbar facet syndrome. We will plan on a diagnostic facet block at her next visit.   ----------------------------------------------------------------------------------------------------------------------  I am having Lisa Stout maintain her predniSONE, benzonatate, albuterol, folic acid, budesonide-formoterol, ipratropium-albuterol, docusate sodium, omeprazole, zolpidem, ALPRAZolam, diazepam, naproxen, and HYDROcodone-acetaminophen. We will continue to administer sodium chloride flush, lactated ringers, iopamidol, sodium chloride flush, lactated ringers, and iopamidol.  Meds ordered this encounter  Medications  . HYDROcodone-acetaminophen (NORCO/VICODIN) 5-325 MG tablet    Sig: Take 1 tablet by mouth every 6 (six) hours as needed for moderate pain or severe pain.    Dispense:  60 tablet    Refill:  0       Follow-up: Return in about 1 month (around 05/09/2016) for  procedure. Medication Follow-up is scheduled for 1 month.   Yevette Edwards, MD

## 2016-04-09 NOTE — Progress Notes (Signed)
Safety precautions to be maintained throughout the outpatient stay will include: orient to surroundings, keep bed in low position, maintain call bell within reach at all times, provide assistance with transfer out of bed and ambulation.  

## 2016-04-09 NOTE — Patient Instructions (Addendum)
Pain Management Discharge Instructions  General Discharge Instructions :  If you need to reach your doctor call: Monday-Friday 8:00 am - 4:00 pm at [phone] or toll free [phone].  After clinic hours [phone] to have operator reach doctor.  Bring all of your medication bottles to all your appointments in the pain clinic.  To cancel or reschedule your appointment with Pain Management please remember to call 24 hours in advance to avoid a fee.  Refer to the educational materials which you have been given on: General Risks, I had my Procedure. Discharge Instructions, Post Sedation.  Post Procedure Instructions:  The drugs you were given will stay in your system until tomorrow, so for the next 24 hours you should not drive, make any legal decisions or drink any alcoholic beverages.  You may eat anything you prefer, but it is better to start with liquids then soups and crackers, and gradually work up to solid foods.  Please notify your doctor immediately if you have any unusual bleeding, trouble breathing or pain that is not related to your normal pain.  Depending on the type of procedure that was done, some parts of your body may feel week and/or numb.  This usually clears up by tonight or the next day.  Walk with the use of an assistive device or accompanied by an adult for the 24 hours.  You may use ice on the affected area for the first 24 hours.  Put ice in a Ziploc bag and cover with a towel and place against area 15 minutes on 15 minutes off.  You may switch to heat after 24 hours.GENERAL RISKS AND COMPLICATIONS  What are the risk, side effects and possible complications? Generally speaking, most procedures are safe.  However, with any procedure there are risks, side effects, and the possibility of complications.  The risks and complications are dependent upon the sites that are lesioned, or the type of nerve block to be performed.  The closer the procedure is to the spine,  the more serious the risks are.  Great care is taken when placing the radio frequency needles, block needles or lesioning probes, but sometimes complications can occur. 1. Infection: Any time there is an injection through the skin, there is a risk of infection.  This is why sterile conditions are used for these blocks.  There are four possible types of infection. 1. Localized skin infection. 2. Central Nervous System Infection-This can be in the form of Meningitis, which can be deadly. 3. Epidural Infections-This can be in the form of an epidural abscess, which can cause pressure inside of the spine, causing compression of the spinal cord with subsequent paralysis. This would require an emergency surgery to decompress, and there are no guarantees that the patient would recover from the paralysis. 4. Discitis-This is an infection of the intervertebral discs.  It occurs in about 1% of discography procedures.  It is difficult to treat and it may lead to surgery.        2. Pain: the needles have to go through skin and soft tissues, will cause soreness.       3. Damage to internal structures:  The nerves to be lesioned may be near blood vessels or    other nerves which can be potentially damaged.       4. Bleeding: Bleeding is more common if the patient is taking blood thinners such as  aspirin, Coumadin, Ticiid, Plavix, etc., or if he/she have some genetic predisposition  such as   hemophilia. Bleeding into the spinal canal can cause compression of the spinal  cord with subsequent paralysis.  This would require an emergency surgery to  decompress and there are no guarantees that the patient would recover from the  paralysis.       5. Pneumothorax:  Puncturing of a lung is a possibility, every time a needle is introduced in  the area of the chest or upper back.  Pneumothorax refers to free air around the  collapsed lung(s), inside of the thoracic cavity (chest cavity).  Another two possible  complications  related to a similar event would include: Hemothorax and Chylothorax.   These are variations of the Pneumothorax, where instead of air around the collapsed  lung(s), you may have blood or chyle, respectively.       6. Spinal headaches: They may occur with any procedures in the area of the spine.       7. Persistent CSF (Cerebro-Spinal Fluid) leakage: This is a rare problem, but may occur  with prolonged intrathecal or epidural catheters either due to the formation of a fistulous  track or a dural tear.       8. Nerve damage: By working so close to the spinal cord, there is always a possibility of  nerve damage, which could be as serious as a permanent spinal cord injury with  paralysis.       9. Death:  Although rare, severe deadly allergic reactions known as "Anaphylactic  reaction" can occur to any of the medications used.      10. Worsening of the symptoms:  We can always make thing worse.  What are the chances of something like this happening? Chances of any of this occuring are extremely low.  By statistics, you have more of a chance of getting killed in a motor vehicle accident: while driving to the hospital than any of the above occurring .  Nevertheless, you should be aware that they are possibilities.  In general, it is similar to taking a shower.  Everybody knows that you can slip, hit your head and get killed.  Does that mean that you should not shower again?  Nevertheless always keep in mind that statistics do not mean anything if you happen to be on the wrong side of them.  Even if a procedure has a 1 (one) in a 1,000,000 (million) chance of going wrong, it you happen to be that one..Also, keep in mind that by statistics, you have more of a chance of having something go wrong when taking medications.  Who should not have this procedure? If you are on a blood thinning medication (e.g. Coumadin, Plavix, see list of "Blood Thinners"), or if you have an active infection going on, you should not  have the procedure.  If you are taking any blood thinners, please inform your physician.  How should I prepare for this procedure?  Do not eat or drink anything at least six hours prior to the procedure.  Bring a driver with you .  It cannot be a taxi.  Come accompanied by an adult that can drive you back, and that is strong enough to help you if your legs get weak or numb from the local anesthetic.  Take all of your medicines the morning of the procedure with just enough water to swallow them.  If you have diabetes, make sure that you are scheduled to have your procedure done first thing in the morning, whenever possible.  If you have diabetes,   take only half of your insulin dose and notify our nurse that you have done so as soon as you arrive at the clinic.  If you are diabetic, but only take blood sugar pills (oral hypoglycemic), then do not take them on the morning of your procedure.  You may take them after you have had the procedure.  Do not take aspirin or any aspirin-containing medications, at least eleven (11) days prior to the procedure.  They may prolong bleeding.  Wear loose fitting clothing that may be easy to take off and that you would not mind if it got stained with Betadine or blood.  Do not wear any jewelry or perfume  Remove any nail coloring.  It will interfere with some of our monitoring equipment.  NOTE: Remember that this is not meant to be interpreted as a complete list of all possible complications.  Unforeseen problems may occur.  BLOOD THINNERS The following drugs contain aspirin or other products, which can cause increased bleeding during surgery and should not be taken for 2 weeks prior to and 1 week after surgery.  If you should need take something for relief of minor pain, you may take acetaminophen which is found in Tylenol,m Datril, Anacin-3 and Panadol. It is not blood thinner. The products listed below are.  Do not take any of the products listed below  in addition to any listed on your instruction sheet.  A.P.C or A.P.C with Codeine Codeine Phosphate Capsules #3 Ibuprofen Ridaura  ABC compound Congesprin Imuran rimadil  Advil Cope Indocin Robaxisal  Alka-Seltzer Effervescent Pain Reliever and Antacid Coricidin or Coricidin-D  Indomethacin Rufen  Alka-Seltzer plus Cold Medicine Cosprin Ketoprofen S-A-C Tablets  Anacin Analgesic Tablets or Capsules Coumadin Korlgesic Salflex  Anacin Extra Strength Analgesic tablets or capsules CP-2 Tablets Lanoril Salicylate  Anaprox Cuprimine Capsules Levenox Salocol  Anexsia-D Dalteparin Magan Salsalate  Anodynos Darvon compound Magnesium Salicylate Sine-off  Ansaid Dasin Capsules Magsal Sodium Salicylate  Anturane Depen Capsules Marnal Soma  APF Arthritis pain formula Dewitt's Pills Measurin Stanback  Argesic Dia-Gesic Meclofenamic Sulfinpyrazone  Arthritis Bayer Timed Release Aspirin Diclofenac Meclomen Sulindac  Arthritis pain formula Anacin Dicumarol Medipren Supac  Analgesic (Safety coated) Arthralgen Diffunasal Mefanamic Suprofen  Arthritis Strength Bufferin Dihydrocodeine Mepro Compound Suprol  Arthropan liquid Dopirydamole Methcarbomol with Aspirin Synalgos  ASA tablets/Enseals Disalcid Micrainin Tagament  Ascriptin Doan's Midol Talwin  Ascriptin A/D Dolene Mobidin Tanderil  Ascriptin Extra Strength Dolobid Moblgesic Ticlid  Ascriptin with Codeine Doloprin or Doloprin with Codeine Momentum Tolectin  Asperbuf Duoprin Mono-gesic Trendar  Aspergum Duradyne Motrin or Motrin IB Triminicin  Aspirin plain, buffered or enteric coated Durasal Myochrisine Trigesic  Aspirin Suppositories Easprin Nalfon Trillsate  Aspirin with Codeine Ecotrin Regular or Extra Strength Naprosyn Uracel  Atromid-S Efficin Naproxen Ursinus  Auranofin Capsules Elmiron Neocylate Vanquish  Axotal Emagrin Norgesic Verin  Azathioprine Empirin or Empirin with Codeine Normiflo Vitamin E  Azolid Emprazil Nuprin Voltaren  Bayer  Aspirin plain, buffered or children's or timed BC Tablets or powders Encaprin Orgaran Warfarin Sodium  Buff-a-Comp Enoxaparin Orudis Zorpin  Buff-a-Comp with Codeine Equegesic Os-Cal-Gesic   Buffaprin Excedrin plain, buffered or Extra Strength Oxalid   Bufferin Arthritis Strength Feldene Oxphenbutazone   Bufferin plain or Extra Strength Feldene Capsules Oxycodone with Aspirin   Bufferin with Codeine Fenoprofen Fenoprofen Pabalate or Pabalate-SF   Buffets II Flogesic Panagesic   Buffinol plain or Extra Strength Florinal or Florinal with Codeine Panwarfarin   Buf-Tabs Flurbiprofen Penicillamine   Butalbital Compound Four-way cold tablets   Penicillin   Butazolidin Fragmin Pepto-Bismol   Carbenicillin Geminisyn Percodan   Carna Arthritis Reliever Geopen Persantine   Carprofen Gold's salt Persistin   Chloramphenicol Goody's Phenylbutazone   Chloromycetin Haltrain Piroxlcam   Clmetidine heparin Plaquenil   Cllnoril Hyco-pap Ponstel   Clofibrate Hydroxy chloroquine Propoxyphen         Before stopping any of these medications, be sure to consult the physician who ordered them.  Some, such as Coumadin (Warfarin) are ordered to prevent or treat serious conditions such as "deep thrombosis", "pumonary embolisms", and other heart problems.  The amount of time that you may need off of the medication may also vary with the medication and the reason for which you were taking it.  If you are taking any of these medications, please make sure you notify your pain physician before you undergo any procedures.         Facet Joint Block The facet joints connect the bones of the spine (vertebrae). They make it possible for you to bend, twist, and make other movements with your spine. They also prevent you from overbending, overtwisting, and making other excessive movements.  A facet joint block is a procedure where a numbing medicine (anesthetic) is injected into a facet joint. Often, a type of  anti-inflammatory medicine called a steroid is also injected. A facet joint block may be done for two reasons:   Diagnosis. A facet joint block may be done as a test to see whether neck or back pain is caused by a worn-down or infected facet joint. If the pain gets better after a facet joint block, it means the pain is probably coming from the facet joint. If the pain does not get better, it means the pain is probably not coming from the facet joint.   Therapy. A facet joint block may be done to relieve neck or back pain caused by a facet joint. A facet joint block is only done as a therapy if the pain does not improve with medicine, exercise programs, physical therapy, and other forms of pain management. LET [facility] CARE PROVIDER KNOW ABOUT:   Any allergies you have.   All medicines you are taking, including vitamins, herbs, eyedrops, and over-the-counter medicines and creams.   Previous problems you or members of your family have had with the use of anesthetics.   Any blood disorders you have had.   Other health problems you have. RISKS AND COMPLICATIONS Generally, having a facet joint block is safe. However, as with any procedure, complications can occur. Possible complications associated with having a facet joint block include:   Bleeding.   Injury to a nerve near the injection site.   Pain at the injection site.   Weakness or numbness in areas controlled by nerves near the injection site.   Infection.   Temporary fluid retention.   Allergic reaction to anesthetics or medicines used during the procedure. BEFORE THE PROCEDURE   Follow your health care provider's instructions if you are taking dietary supplements or medicines. You may need to stop taking them or reduce your dosage.   Do not take any new dietary supplements or medicines without asking your health care provider first.   Follow your health care provider's instructions about eating and drinking  before the procedure. You may need to stop eating and drinking several hours before the procedure.   Arrange to have an adult drive you home after the procedure. PROCEDURE  You may need to remove your clothing and  dress in an open-back gown so that your health care provider can access your spine.   The procedure will be done while you are lying on an X-ray table. Most of the time you will be asked to lie on your stomach, but you may be asked to lie in a different position if an injection will be made in your neck.   Special machines will be used to monitor your oxygen levels, heart rate, and blood pressure.   If an injection will be made in your neck, an intravenous (IV) tube will be inserted into one of your veins. Fluids and medicine will flow directly into your body through the IV tube.   The area over the facet joint where the injection will be made will be cleaned with an antiseptic soap. The surrounding skin will be covered with sterile drapes.   An anesthetic will be applied to your skin to make the injection area numb. You may feel a temporary stinging or burning sensation.   A video X-ray machine will be used to locate the joint. A contrast dye may be injected into the facet joint area to help with locating the joint.   When the joint is located, an anesthetic medicine will be injected into the joint through the needle.   Your health care provider will ask you whether you feel pain relief. If you do feel relief, a steroid may be injected to provide pain relief for a longer period of time. If you do not feel relief or feel only partial relief, additional injections of an anesthetic may be made in other facet joints.   The needle will be removed, the skin will be cleansed, and bandages will be applied.  AFTER THE PROCEDURE   You will be observed for 15-30 minutes before being allowed to go home. Do not drive. Have an adult drive you or take a taxi or public transportation  instead.   If you feel pain relief, the pain will return in several hours or days when the anesthetic wears off.   You may feel pain relief 2-14 days after the procedure. The amount of time this relief lasts varies from person to person.   It is normal to feel some tenderness over the injected area(s) for 2 days following the procedure.   If you have diabetes, you may have a temporary increase in blood sugar.   This information is not intended to replace advice given to you by your health care provider. Make sure you discuss any questions you have with your health care provider.   Document Released: 12/03/2006 Document Revised: 08/04/2014 Document Reviewed: 05/03/2012 Elsevier Interactive Patient Education 2016 [employer] Facet Blocks Patient Information  Description: The facets are joints in the spine between the vertebrae.  Like any joints in the body, facets can become irritated and painful.  Arthritis can also effect the facets.  By injecting steroids and local anesthetic in and around these joints, we can temporarily block the nerve supply to them.  Steroids act directly on irritated nerves and tissues to reduce selling and inflammation which often leads to decreased pain.  Facet blocks may be done anywhere along the spine from the neck to the low back depending upon the location of your pain.   After numbing the skin with local anesthetic (like Novocaine), a small needle is passed onto the facet joints under x-ray guidance.  You may experience a sensation of pressure while this is being done.  The entire block usually  lasts about 15-25 minutes.   Conditions which may be treated by facet blocks:   Low back/buttock pain  Neck/shoulder pain  Certain types of headaches  Preparation for the injection:  1. Do not eat any solid food or dairy products within 8 hours of your appointment. 2. You may drink clear liquid up to 3 hours before appointment.  Clear liquids include  water, black coffee, juice or soda.  No milk or cream please. 3. You may take your regular medication, including pain medications, with a sip of water before your appointment.  Diabetics should hold regular insulin (if taken separately) and take 1/2 normal NPH dose the morning of the procedure.  Carry some sugar containing items with you to your appointment. 4. A driver must accompany you and be prepared to drive you home after your procedure. 5. Bring all your current medications with you. 6. An IV may be inserted and sedation may be given at the discretion of the physician. 7. A blood pressure cuff, EKG and other monitors will often be applied during the procedure.  Some patients may need to have extra oxygen administered for a short period. 8. You will be asked to provide medical information, including your allergies and medications, prior to the procedure.  We must know immediately if you are taking blood thinners (like Coumadin/Warfarin) or if you are allergic to IV iodine contrast (dye).  We must know if you could possible be pregnant.  Possible side-effects:   Bleeding from needle site  Infection (rare, may require surgery)  Nerve injury (rare)  Numbness & tingling (temporary)  Difficulty urinating (rare, temporary)  Spinal headache (a headache worse with upright posture)  Light-headedness (temporary)  Pain at injection site (serveral days)  Decreased blood pressure (rare, temporary)  Weakness in arm/leg (temporary)  Pressure sensation in back/neck (temporary)   Call if you experience:   Fever/chills associated with headache or increased back/neck pain  Headache worsened by an upright position  New onset, weakness or numbness of an extremity below the injection site  Hives or difficulty breathing (go to the emergency room)  Inflammation or drainage at the injection site(s)  Severe back/neck pain greater than usual  New symptoms which are concerning to  you  Please note:  Although the local anesthetic injected can often make your back or neck feel good for several hours after the injection, the pain will likely return. It takes 3-7 days for steroids to work.  You may not notice any pain relief for at least one week.  If effective, we will often do a series of 2-3 injections spaced 3-6 weeks apart to maximally decrease your pain.  After the initial series, you may be a candidate for a more permanent nerve block of the facets.  If you have any questions, please call #[phone] [facility] Clinic[facility] R[facility] AND COMPLICATIONS  What are the risk, side effects and possible complications? Generally speaking, most procedures are safe.  However, with any procedure there are risks, side effects, and the possibility of complications.  The risks and complications are dependent upon the sites that are lesioned, or the type of nerve block to be performed.  The closer the procedure is to the spine, the more serious the risks are.  Great care is taken when placing the radio frequency needles, block needles or lesioning probes, but sometimes complications can occur. 1. Infection: Any time there is an injection through the skin, there is a risk of infection.  This is why sterile conditions are used for these blocks.  There are four possible types of infection. 1. Localized skin infection. 2. Central Nervous System Infection-This can be in the form of Meningitis, which can be deadly. 3. Epidural Infections-This can be in the form of an epidural abscess, which can cause pressure inside of the spine, causing compression of the spinal cord with subsequent paralysis. This would require an emergency surgery to decompress, and there are no guarantees that the patient would recover from the paralysis. 4. Discitis-This is an infection of the intervertebral discs.  It occurs in about 1% of discography procedures.  It is difficult to treat and  it may lead to surgery.        2. Pain: the needles have to go through skin and soft tissues, will cause soreness.       3. Damage to internal structures:  The nerves to be lesioned may be near blood vessels or    other nerves which can be potentially damaged.       4. Bleeding: Bleeding is more common if the patient is taking blood thinners such as  aspirin, Coumadin, Ticiid, Plavix, etc., or if he/she have some genetic predisposition  such as hemophilia. Bleeding into the spinal canal can cause compression of the spinal  cord with subsequent paralysis.  This would require an emergency surgery to  decompress and there are no guarantees that the patient would recover from the  paralysis.       5. Pneumothorax:  Puncturing of a lung is a possibility, every time a needle is introduced in  the area of the chest or upper back.  Pneumothorax refers to free air around the  collapsed lung(s), inside of the thoracic cavity (chest cavity).  Another two possible  complications related to a similar event would include: Hemothorax and Chylothorax.   These are variations of the Pneumothorax, where instead of air around the collapsed  lung(s), you may have blood or chyle, respectively.       6. Spinal headaches: They may occur with any procedures in the area of the spine.       7. Persistent CSF (Cerebro-Spinal Fluid) leakage: This is a rare problem, but may occur  with prolonged intrathecal or epidural catheters either due to the formation of a fistulous  track or a dural tear.       8. Nerve damage: By working so close to the spinal cord, there is always a possibility of  nerve damage, which could be as serious as a permanent spinal cord injury with  paralysis.       9. Death:  Although rare, severe deadly allergic reactions known as "Anaphylactic  reaction" can occur to any of the medications used.      10. Worsening of the symptoms:  We can always make thing worse.  What are the chances of something like this  happening? Chances of any of this occuring are extremely low.  By statistics, you have more of a chance of getting killed in a motor vehicle accident: while driving to the hospital than any of the above occurring .  Nevertheless, you should be aware that they are possibilities.  In general, it is similar to taking a shower.  Everybody knows that you can slip, hit your head and get killed.  Does that mean that you should not shower again?  Nevertheless always keep in mind that statistics do not mean anything if you happen to be on the  wrong side of them.  Even if a procedure has a 1 (one) in a 1,000,000 (million) chance of going wrong, it you happen to be that one..Also, keep in mind that by statistics, you have more of a chance of having something go wrong when taking medications.  Who should not have this procedure? If you are on a blood thinning medication (e.g. Coumadin, Plavix, see list of "Blood Thinners"), or if you have an active infection going on, you should not have the procedure.  If you are taking any blood thinners, please inform your physician.  How should I prepare for this procedure?  Do not eat or drink anything at least six hours prior to the procedure.  Bring a driver with you .  It cannot be a taxi.  Come accompanied by an adult that can drive you back, and that is strong enough to help you if your legs get weak or numb from the local anesthetic.  Take all of your medicines the morning of the procedure with just enough water to swallow them.  If you have diabetes, make sure that you are scheduled to have your procedure done first thing in the morning, whenever possible.  If you have diabetes, take only half of your insulin dose and notify our nurse that you have done so as soon as you arrive at the clinic.  If you are diabetic, but only take blood sugar pills (oral hypoglycemic), then do not take them on the morning of your procedure.  You may take them after you have had the  procedure.  Do not take aspirin or any aspirin-containing medications, at least eleven (11) days prior to the procedure.  They may prolong bleeding.  Wear loose fitting clothing that may be easy to take off and that you would not mind if it got stained with Betadine or blood.  Do not wear any jewelry or perfume  Remove any nail coloring.  It will interfere with some of our monitoring equipment.  NOTE: Remember that this is not meant to be interpreted as a complete list of all possible complications.  Unforeseen problems may occur.  BLOOD THINNERS The following drugs contain aspirin or other products, which can cause increased bleeding during surgery and should not be taken for 2 weeks prior to and 1 week after surgery.  If you should need take something for relief of minor pain, you may take acetaminophen which is found in Tylenol,m Datril, Anacin-3 and Panadol. It is not blood thinner. The products listed below are.  Do not take any of the products listed below in addition to any listed on your instruction sheet.  A.P.C or A.P.C with Codeine Codeine Phosphate Capsules #3 Ibuprofen Ridaura  ABC compound Congesprin Imuran rimadil  Advil Cope Indocin Robaxisal  Alka-Seltzer Effervescent Pain Reliever and Antacid Coricidin or Coricidin-D  Indomethacin Rufen  Alka-Seltzer plus Cold Medicine Cosprin Ketoprofen S-A-C Tablets  Anacin Analgesic Tablets or Capsules Coumadin Korlgesic Salflex  Anacin Extra Strength Analgesic tablets or capsules CP-2 Tablets Lanoril Salicylate  Anaprox Cuprimine Capsules Levenox Salocol  Anexsia-D Dalteparin Magan Salsalate  Anodynos Darvon compound Magnesium Salicylate Sine-off  Ansaid Dasin Capsules Magsal Sodium Salicylate  Anturane Depen Capsules Marnal Soma  APF Arthritis pain formula Dewitt's Pills Measurin Stanback  Argesic Dia-Gesic Meclofenamic Sulfinpyrazone  Arthritis Bayer Timed Release Aspirin Diclofenac Meclomen Sulindac  Arthritis pain formula  Anacin Dicumarol Medipren Supac  Analgesic (Safety coated) Arthralgen Diffunasal Mefanamic Suprofen  Arthritis Strength Bufferin Dihydrocodeine Mepro Compound Suprol  Arthropan  liquid Dopirydamole Methcarbomol with Aspirin Synalgos  ASA tablets/Enseals Disalcid Micrainin Tagament  Ascriptin Doan's Midol Talwin  Ascriptin A/D Dolene Mobidin Tanderil  Ascriptin Extra Strength Dolobid Moblgesic Ticlid  Ascriptin with Codeine Doloprin or Doloprin with Codeine Momentum Tolectin  Asperbuf Duoprin Mono-gesic Trendar  Aspergum Duradyne Motrin or Motrin IB Triminicin  Aspirin plain, buffered or enteric coated Durasal Myochrisine Trigesic  Aspirin Suppositories Easprin Nalfon Trillsate  Aspirin with Codeine Ecotrin Regular or Extra Strength Naprosyn Uracel  Atromid-S Efficin Naproxen Ursinus  Auranofin Capsules Elmiron Neocylate Vanquish  Axotal Emagrin Norgesic Verin  Azathioprine Empirin or Empirin with Codeine Normiflo Vitamin E  Azolid Emprazil Nuprin Voltaren  Bayer Aspirin plain, buffered or children's or timed BC Tablets or powders Encaprin Orgaran Warfarin Sodium  Buff-a-Comp Enoxaparin Orudis Zorpin  Buff-a-Comp with Codeine Equegesic Os-Cal-Gesic   Buffaprin Excedrin plain, buffered or Extra Strength Oxalid   Bufferin Arthritis Strength Feldene Oxphenbutazone   Bufferin plain or Extra Strength Feldene Capsules Oxycodone with Aspirin   Bufferin with Codeine Fenoprofen Fenoprofen Pabalate or Pabalate-SF   Buffets II Flogesic Panagesic   Buffinol plain or Extra Strength Florinal or Florinal with Codeine Panwarfarin   Buf-Tabs Flurbiprofen Penicillamine   Butalbital Compound Four-way cold tablets Penicillin   Butazolidin Fragmin Pepto-Bismol   Carbenicillin Geminisyn Percodan   Carna Arthritis Reliever Geopen Persantine   Carprofen Gold's salt Persistin   Chloramphenicol Goody's Phenylbutazone   Chloromycetin Haltrain Piroxlcam   Clmetidine heparin Plaquenil   Cllnoril Hyco-pap  Ponstel   Clofibrate Hydroxy chloroquine Propoxyphen         Before stopping any of these medications, be sure to consult the physician who ordered them.  Some, such as Coumadin (Warfarin) are ordered to prevent or treat serious conditions such as "deep thrombosis", "pumonary embolisms", and other heart problems.  The amount of time that you may need off of the medication may also vary with the medication and the reason for which you were taking it.  If you are taking any of these medications, please make sure you notify your pain physician before you undergo any procedures.

## 2016-04-10 ENCOUNTER — Telehealth: Payer: Self-pay | Admitting: *Deleted

## 2016-04-10 ENCOUNTER — Ambulatory Visit
Admission: RE | Admit: 2016-04-10 | Discharge: 2016-04-10 | Disposition: A | Discharge status: Home / Self Care | Payer: Medicaid Other | Source: Ambulatory Visit | Attending: Anesthesiology | Admitting: Anesthesiology

## 2016-04-10 DIAGNOSIS — M5417 Radiculopathy, lumbosacral region: Secondary | ICD-10-CM | POA: Insufficient documentation

## 2016-04-10 DIAGNOSIS — M5136 Other intervertebral disc degeneration, lumbar region: Secondary | ICD-10-CM | POA: Insufficient documentation

## 2016-04-10 DIAGNOSIS — M4806 Spinal stenosis, lumbar region: Secondary | ICD-10-CM | POA: Insufficient documentation

## 2016-04-10 DIAGNOSIS — M5416 Radiculopathy, lumbar region: Secondary | ICD-10-CM

## 2016-04-11 ENCOUNTER — Telehealth: Payer: Self-pay

## 2016-04-11 NOTE — Telephone Encounter (Signed)
The PA for this medication was sent on 04-10-2016. It takes a minimal time frame of 3 days for answer on the PA. We should have answer by time of patient next refill

## 2016-04-11 NOTE — Telephone Encounter (Signed)
Patient states she had to pay cash for her hydrocodone yesterday at the pharmacy. She said they will give her the money back if we get a authorization from [place]. She says she will need the prior auths from now on.

## 2016-04-16 LAB — TOXASSURE SELECT 13 (MW), URINE

## 2016-05-08 ENCOUNTER — Ambulatory Visit
Admission: RE | Admit: 2016-05-08 | Discharge: 2016-05-08 | Disposition: A | Discharge status: Home / Self Care | Payer: Medicaid Other | Source: Ambulatory Visit | Attending: Anesthesiology | Admitting: Anesthesiology

## 2016-05-08 ENCOUNTER — Encounter: Payer: Self-pay | Admitting: Anesthesiology

## 2016-05-08 ENCOUNTER — Other Ambulatory Visit: Payer: Self-pay | Admitting: Anesthesiology

## 2016-05-08 ENCOUNTER — Ambulatory Visit (HOSPITAL_BASED_OUTPATIENT_CLINIC_OR_DEPARTMENT_OTHER): Payer: Medicaid Other | Admitting: Anesthesiology

## 2016-05-08 ENCOUNTER — Ambulatory Visit: Payer: Medicaid Other | Admitting: Anesthesiology

## 2016-05-08 VITALS — BP 118/87 | HR 68 | Temp 97.8°F | Resp 16 | Ht 64.0 in | Wt 174.0 lb

## 2016-05-08 DIAGNOSIS — M5136 Other intervertebral disc degeneration, lumbar region: Secondary | ICD-10-CM

## 2016-05-08 DIAGNOSIS — K7460 Unspecified cirrhosis of liver: Secondary | ICD-10-CM | POA: Insufficient documentation

## 2016-05-08 DIAGNOSIS — M5030 Other cervical disc degeneration, unspecified cervical region: Secondary | ICD-10-CM | POA: Diagnosis not present

## 2016-05-08 DIAGNOSIS — M5116 Intervertebral disc disorders with radiculopathy, lumbar region: Secondary | ICD-10-CM | POA: Insufficient documentation

## 2016-05-08 DIAGNOSIS — M4697 Unspecified inflammatory spondylopathy, lumbosacral region: Secondary | ICD-10-CM

## 2016-05-08 DIAGNOSIS — Z90710 Acquired absence of both cervix and uterus: Secondary | ICD-10-CM | POA: Insufficient documentation

## 2016-05-08 DIAGNOSIS — R52 Pain, unspecified: Secondary | ICD-10-CM

## 2016-05-08 DIAGNOSIS — Z8249 Family history of ischemic heart disease and other diseases of the circulatory system: Secondary | ICD-10-CM | POA: Diagnosis not present

## 2016-05-08 DIAGNOSIS — M5416 Radiculopathy, lumbar region: Secondary | ICD-10-CM | POA: Diagnosis present

## 2016-05-08 DIAGNOSIS — M545 Low back pain: Secondary | ICD-10-CM | POA: Diagnosis present

## 2016-05-08 DIAGNOSIS — J449 Chronic obstructive pulmonary disease, unspecified: Secondary | ICD-10-CM | POA: Diagnosis not present

## 2016-05-08 DIAGNOSIS — G5600 Carpal tunnel syndrome, unspecified upper limb: Secondary | ICD-10-CM | POA: Insufficient documentation

## 2016-05-08 DIAGNOSIS — F17210 Nicotine dependence, cigarettes, uncomplicated: Secondary | ICD-10-CM | POA: Diagnosis not present

## 2016-05-08 DIAGNOSIS — M5432 Sciatica, left side: Secondary | ICD-10-CM

## 2016-05-08 DIAGNOSIS — M47812 Spondylosis without myelopathy or radiculopathy, cervical region: Secondary | ICD-10-CM

## 2016-05-08 DIAGNOSIS — M5431 Sciatica, right side: Secondary | ICD-10-CM

## 2016-05-08 DIAGNOSIS — M47817 Spondylosis without myelopathy or radiculopathy, lumbosacral region: Secondary | ICD-10-CM

## 2016-05-08 MED ORDER — SODIUM CHLORIDE 0.9% FLUSH: 10.0000 mL | Freq: Once | INTRAVENOUS | Status: AC

## 2016-05-08 MED ORDER — HYDROCODONE-ACETAMINOPHEN 5-325 MG PO TABS: 1.0000 | ORAL_TABLET | Freq: Two times a day (BID) | ORAL | 0 refills | Status: DC

## 2016-05-08 MED ORDER — LIDOCAINE HCL (PF) 1 % IJ SOLN
5.0000 mL | Freq: Once | INTRAMUSCULAR | Status: AC
  Administered 2016-05-08: 5 mL via SUBCUTANEOUS

## 2016-05-08 MED ORDER — MIDAZOLAM HCL 5 MG/5ML IJ SOLN
INTRAMUSCULAR | Status: AC
Start: 2016-05-08 — End: 2016-05-08
  Administered 2016-05-08: 2 mg via INTRAVENOUS
  Filled 2016-05-08: qty 5

## 2016-05-08 MED ORDER — TRIAMCINOLONE ACETONIDE 40 MG/ML IJ SUSP
40.0000 mg | Freq: Once | INTRAMUSCULAR | Status: AC
  Administered 2016-05-08: 40 mg
  Filled 2016-05-08: qty 1

## 2016-05-08 MED ORDER — ROPIVACAINE HCL 2 MG/ML IJ SOLN
10.0000 mL | Freq: Once | INTRAMUSCULAR | Status: AC
  Administered 2016-05-08: 10 mL via EPIDURAL
  Filled 2016-05-08: qty 10

## 2016-05-08 MED ORDER — LACTATED RINGERS IV SOLN
1000.0000 mL | INTRAVENOUS | Status: AC
  Administered 2016-05-08: 1000 mL via INTRAVENOUS

## 2016-05-08 MED ORDER — MIDAZOLAM HCL 2 MG/2ML IJ SOLN: 5.0000 mg | Freq: Once | INTRAMUSCULAR | Status: AC

## 2016-05-08 MED ORDER — FENTANYL CITRATE (PF) 100 MCG/2ML IJ SOLN
INTRAMUSCULAR | Status: AC
  Administered 2016-05-08: 25 ug via INTRAVENOUS
  Filled 2016-05-08: qty 2

## 2016-05-08 NOTE — Progress Notes (Signed)
Safety precautions to be maintained throughout the outpatient stay will include: orient to surroundings, keep bed in low position, maintain call bell within reach at all times, provide assistance with transfer out of bed and ambulation.  

## 2016-05-08 NOTE — Patient Instructions (Signed)
Facet Joint Block The facet joints connect the bones of the spine (vertebrae). They make it possible for you to bend, twist, and make other movements with your spine. They also prevent you from overbending, overtwisting, and making other excessive movements.  A facet joint block is a procedure where a numbing medicine (anesthetic) is injected into a facet joint. Often, a type of anti-inflammatory medicine called a steroid is also injected. A facet joint block may be done for two reasons:   Diagnosis. A facet joint block may be done as a test to see whether neck or back pain is caused by a worn-down or infected facet joint. If the pain gets better after a facet joint block, it means the pain is probably coming from the facet joint. If the pain does not get better, it means the pain is probably not coming from the facet joint.   Therapy. A facet joint block may be done to relieve neck or back pain caused by a facet joint. A facet joint block is only done as a therapy if the pain does not improve with medicine, exercise programs, physical therapy, and other forms of pain management. LET YOUR HEALTH CARE PROVIDER KNOW ABOUT:   Any allergies you have.   All medicines you are taking, including vitamins, herbs, eyedrops, and over-the-counter medicines and creams.   Previous problems you or members of your family have had with the use of anesthetics.   Any blood disorders you have had.   Other health problems you have. RISKS AND COMPLICATIONS Generally, having a facet joint block is safe. However, as with any procedure, complications can occur. Possible complications associated with having a facet joint block include:   Bleeding.   Injury to a nerve near the injection site.   Pain at the injection site.   Weakness or numbness in areas controlled by nerves near the injection site.   Infection.   Temporary fluid retention.   Allergic reaction to anesthetics or medicines used during  the procedure. BEFORE THE PROCEDURE   Follow your health care provider's instructions if you are taking dietary supplements or medicines. You may need to stop taking them or reduce your dosage.   Do not take any new dietary supplements or medicines without asking your health care provider first.   Follow your health care provider's instructions about eating and drinking before the procedure. You may need to stop eating and drinking several hours before the procedure.   Arrange to have an adult drive you home after the procedure. PROCEDURE  You may need to remove your clothing and dress in an open-back gown so that your health care provider can access your spine.   The procedure will be done while you are lying on an X-ray table. Most of the time you will be asked to lie on your stomach, but you may be asked to lie in a different position if an injection will be made in your neck.   Special machines will be used to monitor your oxygen levels, heart rate, and blood pressure.   If an injection will be made in your neck, an intravenous (IV) tube will be inserted into one of your veins. Fluids and medicine will flow directly into your body through the IV tube.   The area over the facet joint where the injection will be made will be cleaned with an antiseptic soap. The surrounding skin will be covered with sterile drapes.   An anesthetic will be applied to your skin   to make the injection area numb. You may feel a temporary stinging or burning sensation.   A video X-ray machine will be used to locate the joint. A contrast dye may be injected into the facet joint area to help with locating the joint.   When the joint is located, an anesthetic medicine will be injected into the joint through the needle.   Your health care provider will ask you whether you feel pain relief. If you do feel relief, a steroid may be injected to provide pain relief for a longer period of time. If you do not  feel relief or feel only partial relief, additional injections of an anesthetic may be made in other facet joints.   The needle will be removed, the skin will be cleansed, and bandages will be applied.  AFTER THE PROCEDURE   You will be observed for 15-30 minutes before being allowed to go home. Do not drive. Have an adult drive you or take a taxi or public transportation instead.   If you feel pain relief, the pain will return in several hours or days when the anesthetic wears off.   You may feel pain relief 2-14 days after the procedure. The amount of time this relief lasts varies from person to person.   It is normal to feel some tenderness over the injected area(s) for 2 days following the procedure.   If you have diabetes, you may have a temporary increase in blood sugar.   This information is not intended to replace advice given to you by your health care provider. Make sure you discuss any questions you have with your health care provider.   Document Released: 12/03/2006 Document Revised: 08/04/2014 Document Reviewed: 05/03/2012 [employer] Interactive Patient Education 2016 [employer] Pain Management Discharge Instructions  General Discharge Instructions :  If you need to reach your doctor call: Monday-Friday 8:00 am - 4:00 pm at [phone] or toll free [phone].  After clinic hours [phone] to have operator reach doctor.  Bring all of your medication bottles to all your appointments in the pain clinic.  To cancel or reschedule your appointment with Pain Management please remember to call 24 hours in advance to avoid a fee.  Refer to the educational materials which you have been given on: General Risks, I had my Procedure. Discharge Instructions, Post Sedation.  Post Procedure Instructions:  The drugs you were given will stay in your system until tomorrow, so for the next 24 hours you should not drive, make any legal decisions or drink any alcoholic  beverages.  You may eat anything you prefer, but it is better to start with liquids then soups and crackers, and gradually work up to solid foods.  Please notify your doctor immediately if you have any unusual bleeding, trouble breathing or pain that is not related to your normal pain.  Depending on the type of procedure that was done, some parts of your body may feel week and/or numb.  This usually clears up by tonight or the next day.  Walk with the use of an assistive device or accompanied by an adult for the 24 hours.  You may use ice on the affected area for the first 24 hours.  Put ice in a Ziploc bag and cover with a towel and place against area 15 minutes on 15 minutes off.  You may switch to heat after 24 hours. 

## 2016-05-09 ENCOUNTER — Telehealth: Payer: Self-pay

## 2016-05-09 NOTE — Progress Notes (Signed)
Subjective:  Patient ID: Lisa Stout, female    DOB: May 21, 1961  Age: 55 y.o. MRN: [id number]  CC: Back Pain   Procedure: Bilateral L3-4 L4-5 L5-S1 and S1 medial branch block to the afferent mentioned facets under fluoroscopic guidance and moderate sedation Previous Procedure: Lumbar epidural series without significant improvement  HPI Lisa Stout presents for re evaluation today.She continues to complain of low back pain of the same quality characteristic and distribution. Primarily centralized low back with occasional radiation down the legs as before With pain that radiates into the bilateral hips and buttocks.. She's taking her medications as prescribed and occasionally taking an extra half tablet 2 tablet per day for low back spasming. Otherwise her symptom complex is unchanged.    History Lisa Stout has a past medical history of Arthritis; Carpal tunnel syndrome; Carpal tunnel syndrome (2015); Cirrhosis of liver (HCC) (2006); COPD (chronic obstructive pulmonary disease) (HCC); Renal disorder; and Respiratory abnormalities.   She has a past surgical history that includes Hand surgery (Left, 2014); Abdominal hysterectomy; and Appendectomy.   Her family history includes Heart disease in her brother, brother, brother, brother, father, and mother; Mental illness in her brother.She reports that she has been smoking Cigarettes.  She has been smoking about 0.50 packs per day. She has never used smokeless tobacco. She reports that she does not drink alcohol or use drugs.   ---------------------------------------------------------------------------------------------------------------------- Past Medical History:  Diagnosis Date  . Arthritis   . Carpal tunnel syndrome   . Carpal tunnel syndrome 2015   bilateral  . Cirrhosis of liver (HCC) 2006  . COPD (chronic obstructive pulmonary disease) (HCC)   . Renal disorder   . Respiratory abnormalities    Pneumonia    Past Surgical  History:  Procedure Laterality Date  . ABDOMINAL HYSTERECTOMY    . APPENDECTOMY    . HAND SURGERY Left 2014    Family History  Problem Relation Age of Onset  . Heart disease Mother   . Heart disease Father   . Mental illness Brother   . Heart disease Brother   . Heart disease Brother   . Heart disease Brother   . Heart disease Brother     Social History  Substance Use Topics  . Smoking status: Current Some Day Smoker    Packs/day: 0.50    Types: Cigarettes  . Smokeless tobacco: Never Used  . Alcohol use No    ---------------------------------------------------------------------------------------------------------------------- Social History   Social History  . Marital status: Single    Spouse name: N/A  . Number of children: N/A  . Years of education: N/A   Social History Main Topics  . Smoking status: Current Some Day Smoker    Packs/day: 0.50    Types: Cigarettes  . Smokeless tobacco: Never Used  . Alcohol use No  . Drug use: No  . Sexual activity: Not Asked   Other Topics Concern  . None   Social History Narrative  . None      ----------------------------------------------------------------------------------------------------------------------  ROS Review of Systems   Objective:  BP 118/87   Pulse 68   Temp 97.8 F (36.6 C)   Resp 16   Ht 5\' 4"  (1.626 m)   Wt 174 lb (78.9 kg)   SpO2 99%   BMI 29.87 kg/m   Physical Exam  Patient is a reasonable historian alert oriented cooperative compliant Extraocular muscles are intact pupils equally round reactive to light Heart regular rate and rhythm without murmur Lungs with distant breath sounds and bilateral expiratory  wheezing Low back reveals bilateral paraspinous muscle tenderness with pain on extension and bilateral rotation in the standing position. Extension of the low back with right and left lateral rotation does Yield reproduction of back pain she is complaining of. In the Supine  position she has a positive straight leg raise right side.   Assessment & Plan:   Dorice was seen today for back pain.  Diagnoses and all orders for this visit:  Facet arthritis of lumbosacral region (HCC) -     LUMBAR FACET(MEDIAL BRANCH NERVE BLOCK) MBNB -     triamcinolone acetonide (KENALOG-40) injection 40 mg; 1 mL (40 mg total) by Other route once. -     sodium chloride flush (NS) 0.9 % injection 10 mL; 10 mLs by Other route once. -     ropivacaine (PF) 2 mg/ml (0.2%) (NAROPIN) epidural 10 mL; 10 mLs by Epidural route once. -     midazolam (VERSED) injection 5 mg; Inject 5 mLs (5 mg total) into the vein once. -     lidocaine (PF) (XYLOCAINE) 1 % injection 5 mL; Inject 5 mLs into the skin once. -     lactated ringers infusion 1,000 mL; Inject 1,000 mLs into the vein continuous. -     LUMBAR FACET(MEDIAL BRANCH NERVE BLOCK) MBNB; Future  DDD (degenerative disc disease), lumbar -     ToxASSURE Select 13 (MW), Urine  Complaints of total body pain -     ToxASSURE Select 13 (MW), Urine -     ToxASSURE Select 13 (MW), Urine  DJD (degenerative joint disease), cervical -     ToxASSURE Select 13 (MW), Urine -     ToxASSURE Select 13 (MW), Urine  Bilateral sciatica  Other orders -     HYDROcodone-acetaminophen (NORCO/VICODIN) 5-325 MG tablet; Take 1 tablet by mouth 2 (two) times daily. -     fentaNYL (SUBLIMAZE) 100 MCG/2ML injection;  -     midazolam (VERSED) 5 MG/5ML injection;      ----------------------------------------------------------------------------------------------------------------------  Problem List Items Addressed This Visit    None    Visit Diagnoses    Facet arthritis of lumbosacral region (HCC)    -  Primary   Relevant Medications   naproxen (NAPROSYN) 500 MG tablet   triamcinolone acetonide (KENALOG-40) injection 40 mg (Completed)   sodium chloride flush (NS) 0.9 % injection 10 mL   ropivacaine (PF) 2 mg/ml (0.2%) (NAROPIN) epidural 10 mL (Completed)    midazolam (VERSED) injection 5 mg   lidocaine (PF) (XYLOCAINE) 1 % injection 5 mL (Completed)   lactated ringers infusion 1,000 mL   HYDROcodone-acetaminophen (NORCO/VICODIN) 5-325 MG tablet   fentaNYL (SUBLIMAZE) 100 MCG/2ML injection (Completed)   Other Relevant Orders   LUMBAR FACET(MEDIAL BRANCH NERVE BLOCK) MBNB   LUMBAR FACET(MEDIAL BRANCH NERVE BLOCK) MBNB   DDD (degenerative disc disease), lumbar       Relevant Medications   naproxen (NAPROSYN) 500 MG tablet   triamcinolone acetonide (KENALOG-40) injection 40 mg (Completed)   HYDROcodone-acetaminophen (NORCO/VICODIN) 5-325 MG tablet   fentaNYL (SUBLIMAZE) 100 MCG/2ML injection (Completed)   Complaints of total body pain       Relevant Orders   ToxASSURE Select 13 (MW), Urine   DJD (degenerative joint disease), cervical       Relevant Medications   naproxen (NAPROSYN) 500 MG tablet   triamcinolone acetonide (KENALOG-40) injection 40 mg (Completed)   HYDROcodone-acetaminophen (NORCO/VICODIN) 5-325 MG tablet   fentaNYL (SUBLIMAZE) 100 MCG/2ML injection (Completed)   Other Relevant Orders  ToxASSURE Select 13 (MW), Urine   Bilateral sciatica       Relevant Medications   gabapentin (NEURONTIN) 300 MG capsule   risperiDONE (RISPERDAL) 0.5 MG tablet   sertraline (ZOLOFT) 50 MG tablet   midazolam (VERSED) injection 5 mg   midazolam (VERSED) 5 MG/5ML injection (Completed)      ----------------------------------------------------------------------------------------------------------------------  1. Bilateral lumbar facet arthropathy. Plan on proceeding with a diagnostic facet block today with the risks and benefits fully reviewed and all questions answered.  2. DDD (degenerative disc disease), lumbar I' we have reviewed her MRI findings in detail today. 3. DDD (degenerative disc disease), cervical Continue physical therapy exercises  4. Right lumbar radiculitis  5. Complaints of total body pain Continue  Naprosyn.  6. DJD (degenerative joint disease), cervical  7. Tox assure reveals a previous positive tox screen for cocaine. Her most recent tox screen was negative for this.   ----------------------------------------------------------------------------------------------------------------------  I have discontinued Ms. Leinberger's HYDROcodone-acetaminophen. I have also changed her HYDROcodone-acetaminophen. Additionally, I am having her maintain her predniSONE, benzonatate, albuterol, folic acid, budesonide-formoterol, ipratropium-albuterol, docusate sodium, omeprazole, zolpidem, ALPRAZolam, diazepam, naproxen, budesonide-formoterol, Vitamin D (Ergocalciferol), gabapentin, lactulose, risperiDONE, sertraline, albuterol, albuterol, Albuterol Sulfate, docusate sodium, folic acid, naproxen, and omeprazole. We administered triamcinolone acetonide, ropivacaine (PF) 2 mg/ml (0.2%), lidocaine (PF), lactated ringers, fentaNYL, and midazolam. We will continue to administer sodium chloride flush, lactated ringers, iopamidol, sodium chloride flush, lactated ringers, iopamidol, sodium chloride flush, midazolam, and lactated ringers.   Meds ordered this encounter  Medications  . budesonide-formoterol (SYMBICORT) 160-4.5 MCG/ACT inhaler    Sig: Inhale into the lungs.  . Vitamin D, Ergocalciferol, (DRISDOL) 50000 units CAPS capsule    Sig: Take by mouth.  . gabapentin (NEURONTIN) 300 MG capsule    Sig: Take 300 mg by mouth.  . lactulose (CHRONULAC) 10 GM/15ML solution    Sig: Take by mouth.  . risperiDONE (RISPERDAL) 0.5 MG tablet    Sig: Take by mouth.  . sertraline (ZOLOFT) 50 MG tablet    Sig: Take 50 mg by mouth.  Marland Kitchen albuterol (PROVENTIL HFA;VENTOLIN HFA) 108 (90 Base) MCG/ACT inhaler    Sig: Inhale into the lungs.  Marland Kitchen albuterol (PROVENTIL) (2.5 MG/3ML) 0.083% nebulizer solution    Sig: 2.5 mg.  . Albuterol Sulfate 108 (90 Base) MCG/ACT AEPB    Sig: Inhale into the lungs.  . docusate sodium (COLACE)  100 MG capsule    Sig: Take by mouth.  . folic acid (FOLVITE) 1 MG tablet    Sig: Take by mouth.  . DISCONTD: HYDROcodone-acetaminophen (NORCO/VICODIN) 5-325 MG tablet    Sig: Take by mouth.  . naproxen (NAPROSYN) 500 MG tablet    Sig: Take by mouth.  Marland Kitchen omeprazole (PRILOSEC) 40 MG capsule    Sig: Take 40 mg by mouth.  . triamcinolone acetonide (KENALOG-40) injection 40 mg  . sodium chloride flush (NS) 0.9 % injection 10 mL  . ropivacaine (PF) 2 mg/ml (0.2%) (NAROPIN) epidural 10 mL  . midazolam (VERSED) injection 5 mg  . lidocaine (PF) (XYLOCAINE) 1 % injection 5 mL  . lactated ringers infusion 1,000 mL  . HYDROcodone-acetaminophen (NORCO/VICODIN) 5-325 MG tablet    Sig: Take 1 tablet by mouth 2 (two) times daily.    Dispense:  60 tablet    Refill:  0  . fentaNYL (SUBLIMAZE) 100 MCG/2ML injection    Roney Mans L: cabinet override  . midazolam (VERSED) 5 MG/5ML injection    Roney Mans L: cabinet override   Procedure: Bilateral lumbar  facet block at L3-4 L4-5 L5-S1 and S1 under moderate sedation with fluoroscopic guidance   Procedure: Bilateral lumbar medial branch block under fluoroscopic guidance at L3-4 L4-5 L5-S1 with sedation Patient was taken to the fluoroscopy suite and placed in the prone position. A total dose of 2 mg of Versed with half cc of fentanyl was titrated for moderate sedation. Vital signs were stable throughout the procedure. The  overlying area of skin on the  right side was prepped with Betadine 3 and strict aseptic technique was utilized throughout the procedure. Flouroscopy was used to  identify the areas overlying the aforementioned facets at  L3-4 or L4-5 and  L5-S1. 1% lidocaine 1 cc was infiltrated subcutaneously and into the fascia with a 25-gauge needle at each of these sites. I then advanced a 22-gauge 3-1/2 inch Quinckie needle with the needle tip to lie at the "[facility]" portion of the [employer] dog". There was negative aspiration for heme or  CSF and  no paresthesia. I then injected 2 cc of ropivacaine 0.2% mixed with 10 mg of triamcinolone at each of the aforementioned sites. These needles were withdrawn and the L5-S1 needle was redirected towards the S1 posterior foramen. This was once again performed with no paresthesia, negative aspiration and 2 cc of this same mixture was injected at this site.   On the opposite side, I then injected 1% lidocaine as above and advanced 22-gauge Quinky needles as before to the [place] eye portion of the [employer] dog". These needles were placed without paresthesia and with strict aseptic technique. As before I injected 2 cc of ropivacaine .2% mixed with 10 mg triamcinolone at each site without evidence of IV or subarachnoid symptoms. These needles were withdrawn and the L5-S1 needle was redirected towards the S1 posterior foramen and advanced without paresthesia with negative aspiration. Once again, I injected 2 cc of the same mixture at that site and the patient was convalesced discharged home in stable condition  Dr. Gwenyth Bender M.D.    Follow-up: Return in about 2 months (around 07/08/2016) for procedure. Medication Follow-up is scheduled for 1 month.   Yevette Edwards, MD

## 2016-05-09 NOTE — Telephone Encounter (Signed)
POst procedure phone call.  States she is sore .  Instructed to put heat on it and give it some time.  Instructed to call for any questions or concerns.

## 2016-06-03 ENCOUNTER — Ambulatory Visit: Payer: Medicaid Other | Attending: Anesthesiology | Admitting: Anesthesiology

## 2016-06-03 ENCOUNTER — Encounter: Payer: Self-pay | Admitting: Anesthesiology

## 2016-06-03 VITALS — BP 120/87 | HR 82 | Temp 97.0°F | Resp 16 | Ht 64.0 in | Wt 179.0 lb

## 2016-06-03 DIAGNOSIS — G894 Chronic pain syndrome: Secondary | ICD-10-CM | POA: Diagnosis not present

## 2016-06-03 DIAGNOSIS — M5116 Intervertebral disc disorders with radiculopathy, lumbar region: Secondary | ICD-10-CM | POA: Insufficient documentation

## 2016-06-03 DIAGNOSIS — Z8249 Family history of ischemic heart disease and other diseases of the circulatory system: Secondary | ICD-10-CM | POA: Insufficient documentation

## 2016-06-03 DIAGNOSIS — M5416 Radiculopathy, lumbar region: Secondary | ICD-10-CM

## 2016-06-03 DIAGNOSIS — M1990 Unspecified osteoarthritis, unspecified site: Secondary | ICD-10-CM | POA: Insufficient documentation

## 2016-06-03 DIAGNOSIS — M5030 Other cervical disc degeneration, unspecified cervical region: Secondary | ICD-10-CM | POA: Insufficient documentation

## 2016-06-03 DIAGNOSIS — F17210 Nicotine dependence, cigarettes, uncomplicated: Secondary | ICD-10-CM | POA: Insufficient documentation

## 2016-06-03 DIAGNOSIS — M5432 Sciatica, left side: Secondary | ICD-10-CM

## 2016-06-03 DIAGNOSIS — M5136 Other intervertebral disc degeneration, lumbar region: Secondary | ICD-10-CM | POA: Diagnosis not present

## 2016-06-03 DIAGNOSIS — M47817 Spondylosis without myelopathy or radiculopathy, lumbosacral region: Secondary | ICD-10-CM

## 2016-06-03 DIAGNOSIS — M1288 Other specific arthropathies, not elsewhere classified, other specified site: Secondary | ICD-10-CM | POA: Insufficient documentation

## 2016-06-03 DIAGNOSIS — M4697 Unspecified inflammatory spondylopathy, lumbosacral region: Secondary | ICD-10-CM

## 2016-06-03 DIAGNOSIS — M5431 Sciatica, right side: Secondary | ICD-10-CM | POA: Diagnosis not present

## 2016-06-03 MED ORDER — HYDROCODONE-ACETAMINOPHEN 5-325 MG PO TABS: 1.0000 | ORAL_TABLET | Freq: Two times a day (BID) | ORAL | 0 refills | Status: DC

## 2016-06-03 MED ORDER — HYDROCODONE-ACETAMINOPHEN 5-325 MG PO TABS: 1.0000 | ORAL_TABLET | Freq: Two times a day (BID) | ORAL | 0 refills | Status: AC

## 2016-06-03 NOTE — Progress Notes (Signed)
Subjective:  Patient ID: Lisa Stout, female    DOB: [birth year]  Age: 55 y.o. MRN: [id number]  CC: Back Pain (low); Knee Pain (bilateral); Hand Pain (bilateral); Shoulder Pain (bilateral); and Foot Pain (bilateral)  Procedure: None Previous Procedure: Bilateral L3-4 L4-5 L5-S1 and S1 medial branch block to the afferent mentioned facets under fluoroscopic guidance and moderate sedation Previous Procedure: Lumbar epidural series without significant improvement  HPI Lisa Stout presents for re evaluation today.she reports some improvement following the facet injections at her last visit. She feels that she is making some mild progress and between the injections and the hydrocodone she feels like her pain is under better control. No change in lower extremity strength or function is noted. Based on her narcotic assessment sheet the medication seem to improve her lifestyle function as we have reviewed today. She is using them as requested with no illicit or diverting use.  History Lisa Stout has a past medical history of Arthritis; Carpal tunnel syndrome; Carpal tunnel syndrome (2015); Cirrhosis of liver (HCC) (2006); COPD (chronic obstructive pulmonary disease) (HCC); Renal disorder; and Respiratory abnormalities.   She has a past surgical history that includes Hand surgery (Left, 2014); Abdominal hysterectomy; and Appendectomy.   Her family history includes Heart disease in her brother, brother, brother, brother, father, and mother; Mental illness in her brother.She reports that she has been smoking Cigarettes.  She has been smoking about 0.50 packs per day. She has never used smokeless tobacco. She reports that she does not drink alcohol or use drugs.   ---------------------------------------------------------------------------------------------------------------------- Past Medical History:  Diagnosis Date  . Arthritis   . Carpal tunnel syndrome   . Carpal tunnel syndrome 2015   bilateral   . Cirrhosis of liver (HCC) 2006  . COPD (chronic obstructive pulmonary disease) (HCC)   . Renal disorder   . Respiratory abnormalities    Pneumonia    Past Surgical History:  Procedure Laterality Date  . ABDOMINAL HYSTERECTOMY    . APPENDECTOMY    . HAND SURGERY Left 2014    Family History  Problem Relation Age of Onset  . Heart disease Mother   . Heart disease Father   . Mental illness Brother   . Heart disease Brother   . Heart disease Brother   . Heart disease Brother   . Heart disease Brother     Social History  Substance Use Topics  . Smoking status: Current Some Day Smoker    Packs/day: 0.50    Types: Cigarettes  . Smokeless tobacco: Never Used  . Alcohol use No    ---------------------------------------------------------------------------------------------------------------------- Social History   Social History  . Marital status: Single    Spouse name: N/A  . Number of children: N/A  . Years of education: N/A   Social History Main Topics  . Smoking status: Current Some Day Smoker    Packs/day: 0.50    Types: Cigarettes  . Smokeless tobacco: Never Used  . Alcohol use No  . Drug use: No  . Sexual activity: Not Asked   Other Topics Concern  . None   Social History Narrative  . None      ----------------------------------------------------------------------------------------------------------------------  ROS Review of Systems   Objective:  BP 120/87   Pulse 82   Temp 97 F (36.1 C) (Oral)   Resp 16   Ht 5\' 4"  (1.626 m)   Wt 179 lb (81.2 kg)   SpO2 97%   BMI 30.73 kg/m   Physical Exam  Patient is a reasonable historian  alert oriented cooperative compliant Extraocular muscles are intact pupils equally round reactive to light Heart regular rate and rhythm without murmur Lungs with distant breath sounds and bilateral expiratory wheezing She continues to have some pain with standing and extension that appears that she does this with  greater ease than in the past and she is ambulating well  Assessment & Plan:   Lisa Stout was seen today for back pain, knee pain, hand pain, shoulder pain and foot pain.  Diagnoses and all orders for this visit:  DDD (degenerative disc disease), lumbar  Facet arthritis of lumbosacral region [facility])  Bilateral sciatica  Chronic pain syndrome  Right lumbar radiculitis  Other orders -     Discontinue: HYDROcodone-acetaminophen (NORCO/VICODIN) 5-325 MG tablet; Take 1 tablet by mouth 2 (two) times daily. -     HYDROcodone-acetaminophen (NORCO/VICODIN) 5-325 MG tablet; Take 1 tablet by mouth 2 (two) times daily.     ----------------------------------------------------------------------------------------------------------------------  Problem List Items Addressed This Visit    None    Visit Diagnoses    DDD (degenerative disc disease), lumbar    -  Primary   Relevant Medications   HYDROcodone-acetaminophen (NORCO/VICODIN) 5-325 MG tablet   Facet arthritis of lumbosacral region [facility])       Relevant Medications   HYDROcodone-acetaminophen (NORCO/VICODIN) 5-325 MG tablet   Bilateral sciatica       Chronic pain syndrome       Right lumbar radiculitis          ----------------------------------------------------------------------------------------------------------------------  1. Bilateral lumbar facet arthropathy.We'll have return to clinic in 1 month where we may proceed with a therapeutic lumbar repeat facet block.   2. DDD (degenerative disc disease), lumbar I' we have reviewed her MRI findings in detail today. 3. DDD (degenerative disc disease), cervical Continue physical therapy exercises  4. Right lumbar radiculitis  5. Complaints of total body pain Continue Naprosyn.  6. DJD (degenerative joint disease), cervical  7. Tox assure reveals a previous positive tox screen for cocaine. Her most recent tox screen was negative for this. She has been compliant with her regimen  and we will refill for hydrocodone No. 60 today. Return to clinic in 2 months   ----------------------------------------------------------------------------------------------------------------------  I am having Lisa Stout maintain her predniSONE, benzonatate, albuterol, folic acid, budesonide-formoterol, ipratropium-albuterol, docusate sodium, omeprazole, zolpidem, ALPRAZolam, diazepam, naproxen, budesonide-formoterol, Vitamin D (Ergocalciferol), gabapentin, lactulose, risperiDONE, sertraline, albuterol, albuterol, Albuterol Sulfate, docusate sodium, folic acid, naproxen, omeprazole, and HYDROcodone-acetaminophen. We will continue to administer sodium chloride flush, lactated ringers, iopamidol, sodium chloride flush, lactated ringers, iopamidol, sodium chloride flush, midazolam, and lactated ringers.   Meds ordered this encounter  Medications  . DISCONTD: HYDROcodone-acetaminophen (NORCO/VICODIN) 5-325 MG tablet    Sig: Take 1 tablet by mouth 2 (two) times daily.    Dispense:  60 tablet    Refill:  0    Do not fill until [id number]  . HYDROcodone-acetaminophen (NORCO/VICODIN) 5-325 MG tablet    Sig: Take 1 tablet by mouth 2 (two) times daily.    Dispense:  60 tablet    Refill:  0    Do not fill until [id number]   Procedure: Bilateral lumbar facet block at L3-4 L4-5 L5-S1 and S1 under moderate sedation with fluoroscopic guidance   Procedure: Bilateral lumbar medial branch block under fluoroscopic guidance at L3-4 L4-5 L5-S1 with sedation Patient was taken to the fluoroscopy suite and placed in the prone position. A total dose of 2 mg of Versed with half cc of fentanyl was titrated for moderate sedation.  Vital signs were stable throughout the procedure. The  overlying area of skin on the  right side was prepped with Betadine 3 and strict aseptic technique was utilized throughout the procedure. Flouroscopy was used to  identify the areas overlying the aforementioned facets at  L3-4 or L4-5 and   L5-S1. 1% lidocaine 1 cc was infiltrated subcutaneously and into the fascia with a 25-gauge needle at each of these sites. I then advanced a 22-gauge 3-1/2 inch Quinckie needle with the needle tip to lie at the "[facility]" portion of the [employer] dog". There was negative aspiration for heme or CSF and  no paresthesia. I then injected 2 cc of ropivacaine 0.2% mixed with 10 mg of triamcinolone at each of the aforementioned sites. These needles were withdrawn and the L5-S1 needle was redirected towards the S1 posterior foramen. This was once again performed with no paresthesia, negative aspiration and 2 cc of this same mixture was injected at this site.   On the opposite side, I then injected 1% lidocaine as above and advanced 22-gauge Quinky needles as before to the [place] eye portion of the [employer] dog". These needles were placed without paresthesia and with strict aseptic technique. As before I injected 2 cc of ropivacaine .2% mixed with 10 mg triamcinolone at each site without evidence of IV or subarachnoid symptoms. These needles were withdrawn and the L5-S1 needle was redirected towards the S1 posterior foramen and advanced without paresthesia with negative aspiration. Once again, I injected 2 cc of the same mixture at that site and the patient was convalesced discharged home in stable condition  Dr. Gwenyth Bender M.D.    Follow-up: Return in about 2 months (around 08/03/2016). Medication Follow-up is scheduled for 1 month.   Yevette Edwards, MD

## 2016-06-03 NOTE — Progress Notes (Signed)
Safety precautions to be maintained throughout the outpatient stay will include: orient to surroundings, keep bed in low position, maintain call bell within reach at all times, provide assistance with transfer out of bed and ambulation.  

## 2016-07-08 ENCOUNTER — Ambulatory Visit: Payer: Medicaid Other | Admitting: Anesthesiology

## 2016-07-28 DEATH — deceased

## 2016-07-31 ENCOUNTER — Encounter: Payer: Medicaid Other | Admitting: Pain Medicine

## 2017-04-23 IMAGING — MR MR LUMBAR SPINE W/O CM
4 of 5 series · 14 of 48 positions shown · non-contrast
Comparison: Lumbar radiographs 04/04/2016.

CLINICAL DATA: 54-year-old female with lumbar back pain radiating
to the right leg as far as the ankle for 1-2 years. Multiple falls
including 2 weeks ago. Prior spinal injections. Initial encounter.

EXAM:
MRI LUMBAR SPINE WITHOUT CONTRAST
TECHNIQUE: Multiplanar, multisequence MR imaging of the lumbar spine was
performed. No intravenous contrast was administered.

[Series 2: T2 · sagittal · 4.0mm · 0.44mm/px · 5 of 15 slices shown (1 of 2)]
[im 1/15]
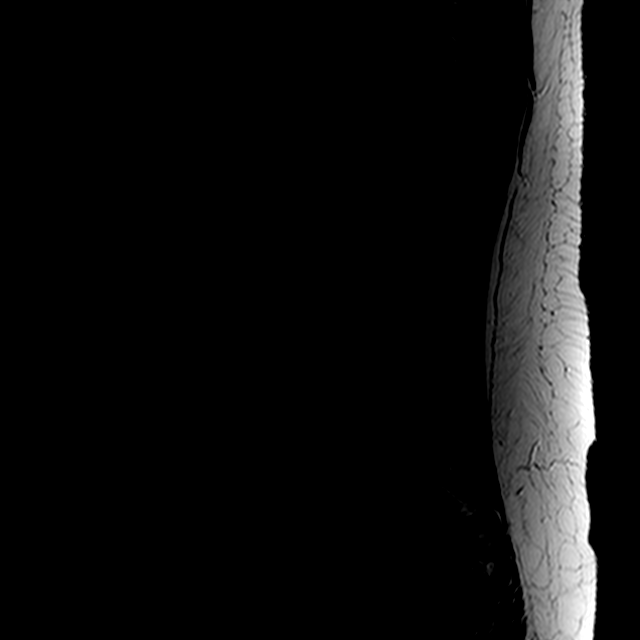
[im 3/15]
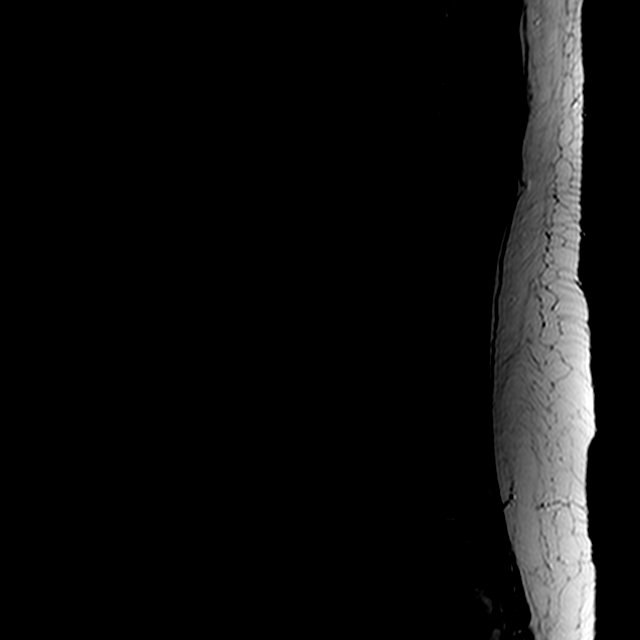
[im 6/15]
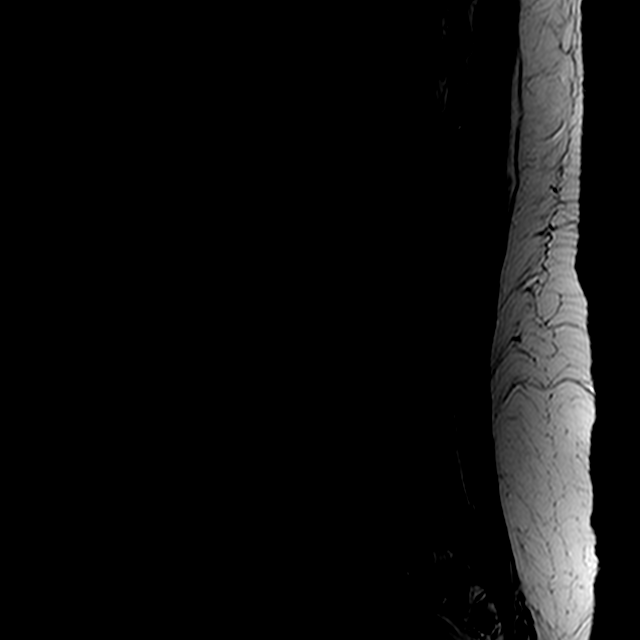
[im 9/15]
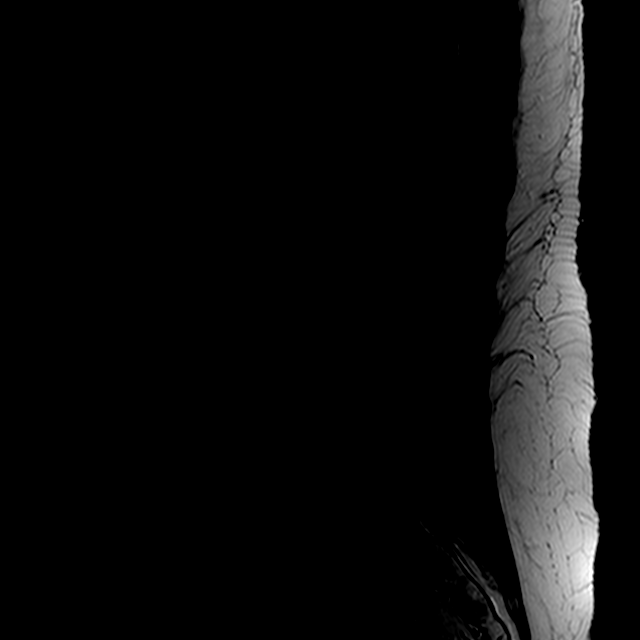
[im 15/15]
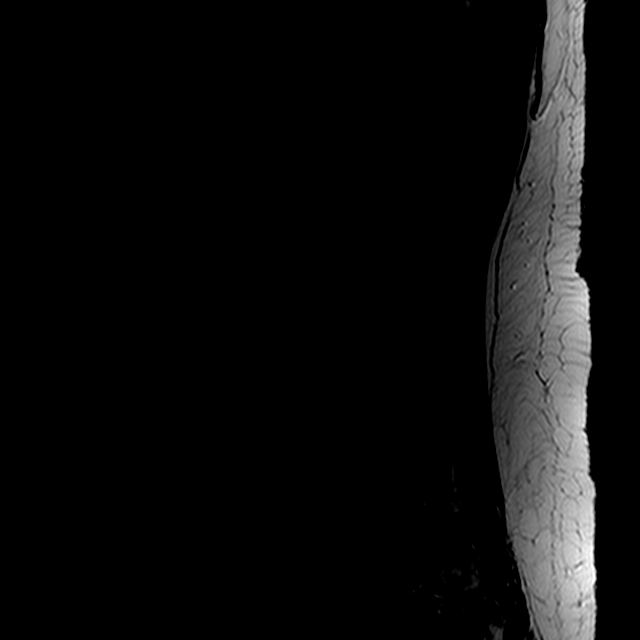

[Series 3: T1 · sagittal · 4.0mm · 0.44mm/px · 3 of 15 slices shown (1 of 2)]
[im 3/15]
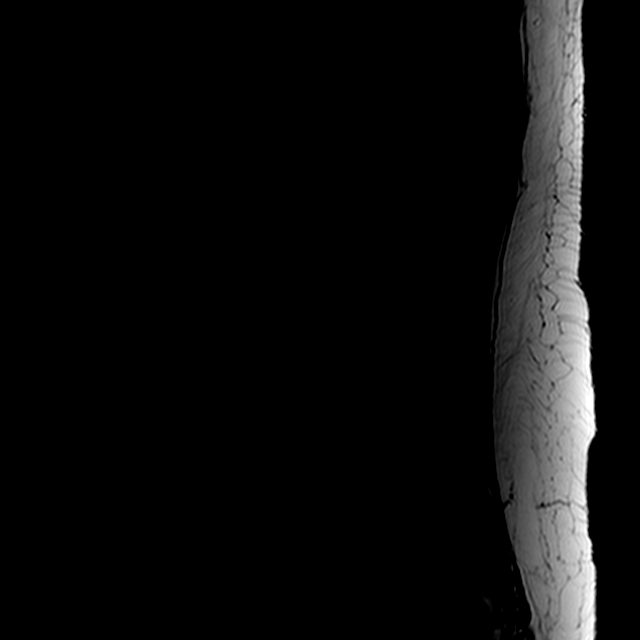
[im 9/15]
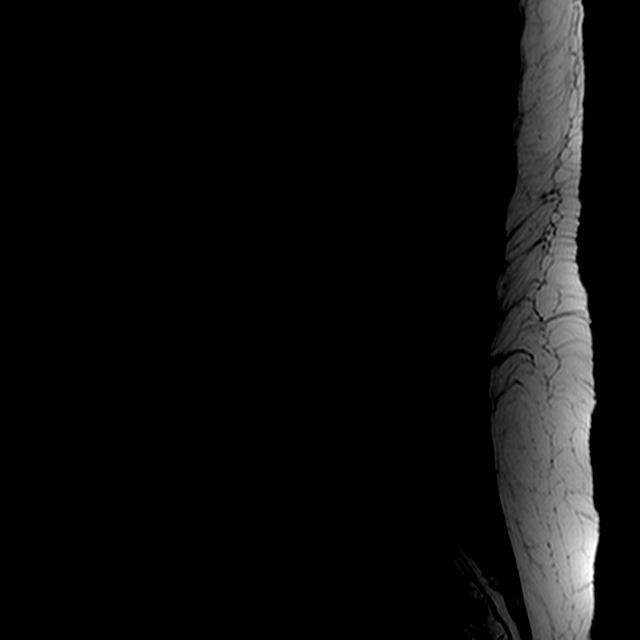
[im 15/15]
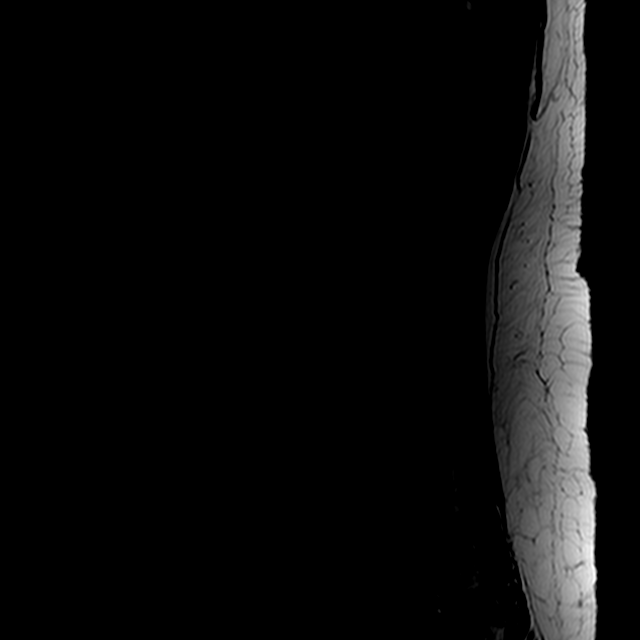

[Series 5: T2 · axial · 4.0mm · 0.39mm/px · z∈[-65,+88]mm · 3 of 38 slices shown (2 of 2)]
[im 6/38]
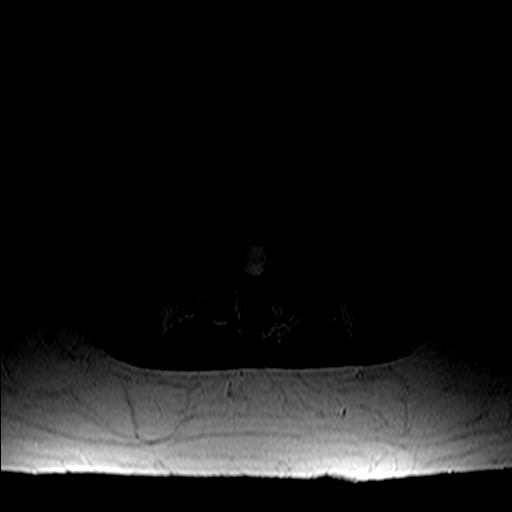
[im 19/38]
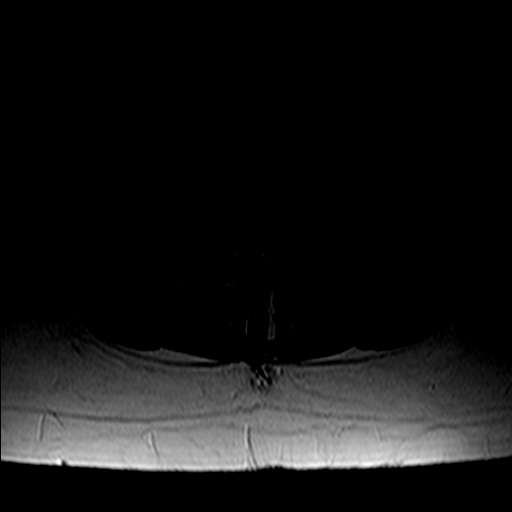
[im 32/38]
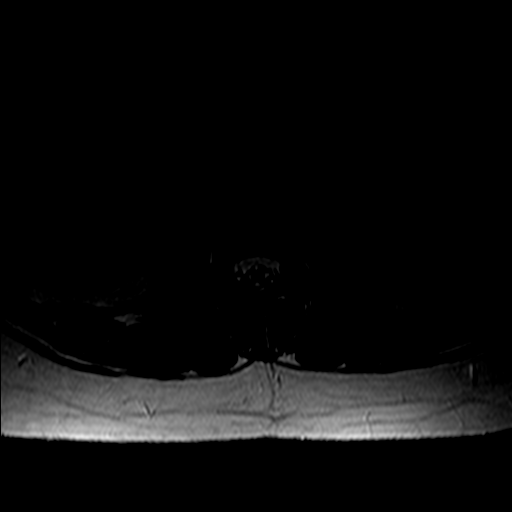

[Series 6: T1 · axial · 4.0mm · 0.39mm/px · z∈[-65,+88]mm · 3 of 38 slices shown (2 of 2)]
[im 6/38]
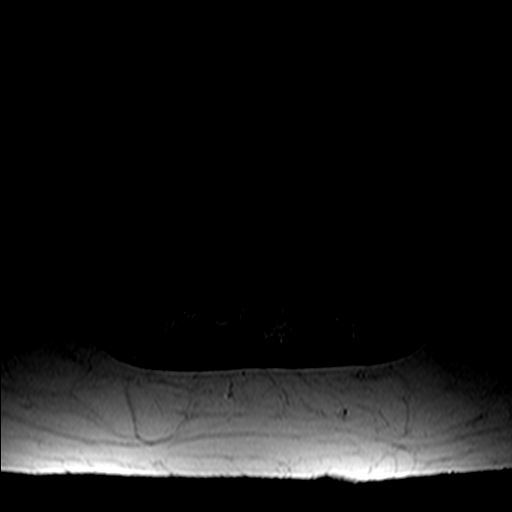
[im 19/38]
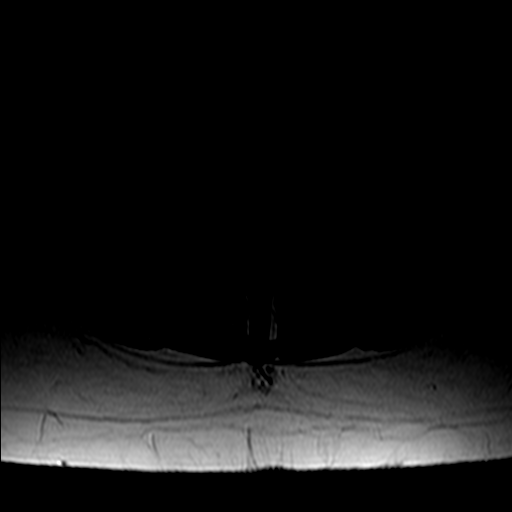
[im 32/38]
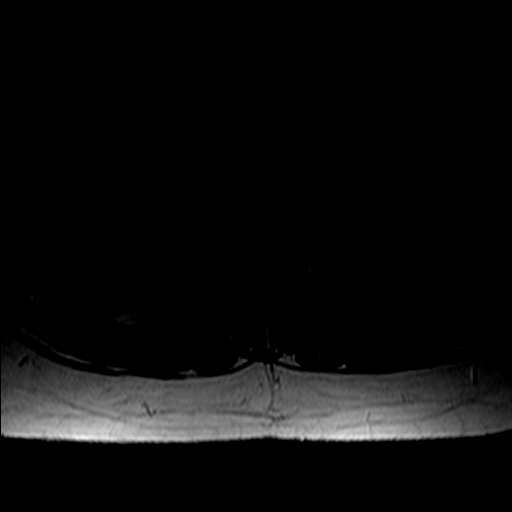

[14 of 48 positions shown; findings below may reference images not displayed]

FINDINGS: Segmentation:  Normal as seen on the comparison radiographs.

Alignment: Straightening and mild reversal of lumbar lordosis
similar to that on the recent radiographs. No spondylolisthesis.

Vertebrae: Benign vertebral body hemangioma in L2. No marrow edema
or evidence of acute osseous abnormality.

Conus medullaris: Extends to the L1 level and appears normal.

Paraspinal and other soft tissues: Negative.

Disc levels:

T11-T12:  Minimal disc bulge.

T12-L1:  Minimal disc bulge and facet hypertrophy.  No stenosis.

L1-L2: Mild posterior disc bulge, broad-based component. Borderline
to mild facet hypertrophy. Borderline to mild spinal stenosis
(series 2, image 8).

L2-L3: Minimal disc bulge. Mild facet hypertrophy. Borderline to
mild spinal stenosis.

L3-L4: Mild disc desiccation. Circumferential disc bulge eccentric
to the left. Mild to moderate facet hypertrophy greater on the left.
Mild spinal stenosis. Mild to moderate left lateral recess stenosis
(series 5, image 23). Mild left L3 foraminal stenosis.

L4-L5: Mild disc desiccation. Mild mostly far lateral disc bulging.
Superimposed small broad-based right foraminal disc protrusion with
annular fissure (series 5, image 28). Mild to moderate facet
hypertrophy. Trace facet joint fluid. No spinal or lateral recess
stenosis. Mild left and moderate right L4 foraminal stenosis.

L5-S1: Mild disc desiccation. Mild mostly far lateral disc bulging
slightly greater on the left. Mild facet hypertrophy. No spinal or
lateral recess stenosis. Mild left L5 foraminal stenosis.
IMPRESSION: 1. Symptomatic level might be L4-L5 where a small right foraminal
disc with annular fissure contributes to moderate stenosis at the
right L4 neural foramen.
2. Borderline to mild multifactorial spinal stenosis at L1-L2
through L3-L4. Mild to moderate left lateral recess stenosis at the
latter.
